# Patient Record
Sex: Male | Born: 1935 | Race: White | Hispanic: No | Marital: Single | Smoking: Never smoker
Health system: Southern US, Community
[De-identification: ages and names within clinical notes are randomized; demographics above are authoritative.]

## PROBLEM LIST (undated history)

## (undated) DIAGNOSIS — R112 Nausea with vomiting, unspecified: Secondary | ICD-10-CM

## (undated) DIAGNOSIS — N210 Calculus in bladder: Secondary | ICD-10-CM

## (undated) DIAGNOSIS — M1990 Unspecified osteoarthritis, unspecified site: Secondary | ICD-10-CM

## (undated) DIAGNOSIS — N400 Enlarged prostate without lower urinary tract symptoms: Secondary | ICD-10-CM

## (undated) DIAGNOSIS — Z9889 Other specified postprocedural states: Secondary | ICD-10-CM

## (undated) DIAGNOSIS — H409 Unspecified glaucoma: Secondary | ICD-10-CM

## (undated) DIAGNOSIS — L409 Psoriasis, unspecified: Secondary | ICD-10-CM

## (undated) DIAGNOSIS — K219 Gastro-esophageal reflux disease without esophagitis: Secondary | ICD-10-CM

## (undated) DIAGNOSIS — Z87898 Personal history of other specified conditions: Secondary | ICD-10-CM

## (undated) HISTORY — PX: COLONOSCOPY: SHX174

---

## 1997-09-30 ENCOUNTER — Other Ambulatory Visit: Admission: RE | Admit: 1997-09-30 | Discharge: 1997-09-30 | Payer: Self-pay | Admitting: Dermatology

## 1998-09-23 ENCOUNTER — Encounter: Admission: RE | Admit: 1998-09-23 | Discharge: 1998-09-28 | Payer: Self-pay

## 1999-03-01 ENCOUNTER — Ambulatory Visit (HOSPITAL_COMMUNITY): Admission: RE | Admit: 1999-03-01 | Discharge: 1999-03-01 | Payer: Self-pay | Admitting: Gastroenterology

## 1999-03-02 ENCOUNTER — Encounter: Payer: Self-pay | Admitting: Gastroenterology

## 2006-04-17 ENCOUNTER — Emergency Department (HOSPITAL_COMMUNITY): Admission: EM | Admit: 2006-04-17 | Discharge: 2006-04-17 | Payer: Self-pay | Admitting: Emergency Medicine

## 2008-03-21 HISTORY — PX: CATARACT EXTRACTION W/ INTRAOCULAR LENS  IMPLANT, BILATERAL: SHX1307

## 2009-11-06 ENCOUNTER — Ambulatory Visit (HOSPITAL_COMMUNITY): Admission: RE | Admit: 2009-11-06 | Discharge: 2009-11-07 | Payer: Self-pay | Admitting: Urology

## 2009-11-06 ENCOUNTER — Encounter (INDEPENDENT_AMBULATORY_CARE_PROVIDER_SITE_OTHER): Payer: Self-pay | Admitting: Urology

## 2009-11-06 HISTORY — PX: TRANSURETHRAL RESECTION OF PROSTATE: SHX73

## 2010-06-04 LAB — COMPREHENSIVE METABOLIC PANEL
ALT: 34 U/L (ref 0–53)
AST: 31 U/L (ref 0–37)
Albumin: 3.7 g/dL (ref 3.5–5.2)
Alkaline Phosphatase: 58 U/L (ref 39–117)
BUN: 19 mg/dL (ref 6–23)
CO2: 27 mEq/L (ref 19–32)
Calcium: 9 mg/dL (ref 8.4–10.5)
Chloride: 112 mEq/L (ref 96–112)
Creatinine, Ser: 1.21 mg/dL (ref 0.4–1.5)
GFR calc Af Amer: >60 mL/min (ref >60)
GFR calc non Af Amer: 59 mL/min — ABNORMAL LOW (ref >60)
Glucose, Bld: 96 mg/dL (ref 70–99)
Potassium: 4.4 mEq/L (ref 3.5–5.1)
Sodium: 143 mEq/L (ref 135–145)
Total Bilirubin: 0.9 mg/dL (ref 0.3–1.2)
Total Protein: 6.3 g/dL (ref 6.0–8.3)

## 2010-06-04 LAB — CBC
HCT: 34.5 % — ABNORMAL LOW (ref 39.0–52.0)
Hemoglobin: 11.5 g/dL — ABNORMAL LOW (ref 13.0–17.0)
Hemoglobin: 9.4 g/dL — ABNORMAL LOW (ref 13.0–17.0)
MCH: 32.9 pg (ref 26.0–34.0)
MCH: 33 pg (ref 26.0–34.0)
MCHC: 33.3 g/dL (ref 30.0–36.0)
MCV: 99 fL (ref 78.0–100.0)
Platelets: 147 10*3/uL — ABNORMAL LOW (ref 150–400)
RBC: 2.87 MIL/uL — ABNORMAL LOW (ref 4.22–5.81)
RBC: 3.49 MIL/uL — ABNORMAL LOW (ref 4.22–5.81)
RDW: 16.6 % — ABNORMAL HIGH (ref 11.5–15.5)
WBC: 4.6 10*3/uL (ref 4.0–10.5)

## 2010-06-04 LAB — PROTIME-INR
INR: 1.1 (ref 0.00–1.49)
Prothrombin Time: 14.4 seconds (ref 11.6–15.2)

## 2010-06-04 LAB — APTT: aPTT: 27 seconds (ref 24–37)

## 2010-06-04 LAB — SURGICAL PCR SCREEN
MRSA, PCR: NEGATIVE
Staphylococcus aureus: POSITIVE — AB

## 2010-06-04 LAB — BASIC METABOLIC PANEL
CO2: 24 mEq/L (ref 19–32)
Calcium: 8 mg/dL — ABNORMAL LOW (ref 8.4–10.5)
GFR calc Af Amer: >60 mL/min (ref >60)
Sodium: 135 mEq/L (ref 135–145)

## 2010-08-09 ENCOUNTER — Other Ambulatory Visit: Payer: Self-pay | Admitting: Dermatology

## 2011-04-11 DIAGNOSIS — L570 Actinic keratosis: Secondary | ICD-10-CM | POA: Diagnosis not present

## 2011-05-11 ENCOUNTER — Other Ambulatory Visit: Payer: Self-pay | Admitting: Dermatology

## 2011-05-11 DIAGNOSIS — L4050 Arthropathic psoriasis, unspecified: Secondary | ICD-10-CM | POA: Diagnosis not present

## 2011-05-11 DIAGNOSIS — Z79899 Other long term (current) drug therapy: Secondary | ICD-10-CM | POA: Diagnosis not present

## 2011-05-11 DIAGNOSIS — C44319 Basal cell carcinoma of skin of other parts of face: Secondary | ICD-10-CM | POA: Diagnosis not present

## 2011-05-11 DIAGNOSIS — L408 Other psoriasis: Secondary | ICD-10-CM | POA: Diagnosis not present

## 2011-05-24 DIAGNOSIS — H409 Unspecified glaucoma: Secondary | ICD-10-CM | POA: Diagnosis not present

## 2011-05-24 DIAGNOSIS — H43819 Vitreous degeneration, unspecified eye: Secondary | ICD-10-CM | POA: Diagnosis not present

## 2011-05-24 DIAGNOSIS — H4010X0 Unspecified open-angle glaucoma, stage unspecified: Secondary | ICD-10-CM | POA: Diagnosis not present

## 2011-05-24 DIAGNOSIS — H04129 Dry eye syndrome of unspecified lacrimal gland: Secondary | ICD-10-CM | POA: Diagnosis not present

## 2011-06-08 DIAGNOSIS — C44319 Basal cell carcinoma of skin of other parts of face: Secondary | ICD-10-CM | POA: Diagnosis not present

## 2011-06-27 DIAGNOSIS — L570 Actinic keratosis: Secondary | ICD-10-CM | POA: Diagnosis not present

## 2011-09-27 DIAGNOSIS — H409 Unspecified glaucoma: Secondary | ICD-10-CM | POA: Diagnosis not present

## 2011-09-27 DIAGNOSIS — H4011X0 Primary open-angle glaucoma, stage unspecified: Secondary | ICD-10-CM | POA: Diagnosis not present

## 2011-10-05 DIAGNOSIS — M79609 Pain in unspecified limb: Secondary | ICD-10-CM | POA: Diagnosis not present

## 2011-10-05 DIAGNOSIS — B351 Tinea unguium: Secondary | ICD-10-CM | POA: Diagnosis not present

## 2011-11-14 DIAGNOSIS — L570 Actinic keratosis: Secondary | ICD-10-CM | POA: Diagnosis not present

## 2011-11-14 DIAGNOSIS — Z79899 Other long term (current) drug therapy: Secondary | ICD-10-CM | POA: Diagnosis not present

## 2011-11-14 DIAGNOSIS — Z85828 Personal history of other malignant neoplasm of skin: Secondary | ICD-10-CM | POA: Diagnosis not present

## 2011-11-14 DIAGNOSIS — L408 Other psoriasis: Secondary | ICD-10-CM | POA: Diagnosis not present

## 2011-12-27 DIAGNOSIS — H409 Unspecified glaucoma: Secondary | ICD-10-CM | POA: Diagnosis not present

## 2011-12-27 DIAGNOSIS — H4011X0 Primary open-angle glaucoma, stage unspecified: Secondary | ICD-10-CM | POA: Diagnosis not present

## 2012-01-27 DIAGNOSIS — H4011X0 Primary open-angle glaucoma, stage unspecified: Secondary | ICD-10-CM | POA: Diagnosis not present

## 2012-01-27 DIAGNOSIS — H409 Unspecified glaucoma: Secondary | ICD-10-CM | POA: Diagnosis not present

## 2012-02-03 DIAGNOSIS — L570 Actinic keratosis: Secondary | ICD-10-CM | POA: Diagnosis not present

## 2012-03-27 DIAGNOSIS — H4011X0 Primary open-angle glaucoma, stage unspecified: Secondary | ICD-10-CM | POA: Diagnosis not present

## 2012-03-27 DIAGNOSIS — H409 Unspecified glaucoma: Secondary | ICD-10-CM | POA: Diagnosis not present

## 2012-05-14 DIAGNOSIS — L408 Other psoriasis: Secondary | ICD-10-CM | POA: Diagnosis not present

## 2012-05-14 DIAGNOSIS — L570 Actinic keratosis: Secondary | ICD-10-CM | POA: Diagnosis not present

## 2012-05-14 DIAGNOSIS — Z79899 Other long term (current) drug therapy: Secondary | ICD-10-CM | POA: Diagnosis not present

## 2012-06-04 DIAGNOSIS — L570 Actinic keratosis: Secondary | ICD-10-CM | POA: Diagnosis not present

## 2012-06-25 DIAGNOSIS — H4011X0 Primary open-angle glaucoma, stage unspecified: Secondary | ICD-10-CM | POA: Diagnosis not present

## 2012-06-25 DIAGNOSIS — H409 Unspecified glaucoma: Secondary | ICD-10-CM | POA: Diagnosis not present

## 2012-07-05 DIAGNOSIS — C44319 Basal cell carcinoma of skin of other parts of face: Secondary | ICD-10-CM | POA: Diagnosis not present

## 2012-07-05 DIAGNOSIS — L570 Actinic keratosis: Secondary | ICD-10-CM | POA: Diagnosis not present

## 2012-07-05 DIAGNOSIS — D485 Neoplasm of uncertain behavior of skin: Secondary | ICD-10-CM | POA: Diagnosis not present

## 2012-07-05 DIAGNOSIS — Z85828 Personal history of other malignant neoplasm of skin: Secondary | ICD-10-CM | POA: Diagnosis not present

## 2012-07-30 DIAGNOSIS — M25569 Pain in unspecified knee: Secondary | ICD-10-CM | POA: Diagnosis not present

## 2012-07-30 DIAGNOSIS — R5381 Other malaise: Secondary | ICD-10-CM | POA: Diagnosis not present

## 2012-07-30 DIAGNOSIS — R5383 Other fatigue: Secondary | ICD-10-CM | POA: Diagnosis not present

## 2012-07-30 DIAGNOSIS — IMO0002 Reserved for concepts with insufficient information to code with codable children: Secondary | ICD-10-CM | POA: Diagnosis not present

## 2012-08-04 DIAGNOSIS — IMO0002 Reserved for concepts with insufficient information to code with codable children: Secondary | ICD-10-CM | POA: Diagnosis not present

## 2012-08-27 DIAGNOSIS — M79609 Pain in unspecified limb: Secondary | ICD-10-CM | POA: Diagnosis not present

## 2012-08-27 DIAGNOSIS — Z23 Encounter for immunization: Secondary | ICD-10-CM | POA: Diagnosis not present

## 2012-08-30 DIAGNOSIS — L03019 Cellulitis of unspecified finger: Secondary | ICD-10-CM | POA: Diagnosis not present

## 2012-09-07 DIAGNOSIS — M76899 Other specified enthesopathies of unspecified lower limb, excluding foot: Secondary | ICD-10-CM | POA: Diagnosis not present

## 2012-09-13 DIAGNOSIS — M76899 Other specified enthesopathies of unspecified lower limb, excluding foot: Secondary | ICD-10-CM | POA: Diagnosis not present

## 2012-09-25 DIAGNOSIS — H2640 Unspecified secondary cataract: Secondary | ICD-10-CM | POA: Diagnosis not present

## 2012-09-25 DIAGNOSIS — H4011X0 Primary open-angle glaucoma, stage unspecified: Secondary | ICD-10-CM | POA: Diagnosis not present

## 2012-09-25 DIAGNOSIS — Z961 Presence of intraocular lens: Secondary | ICD-10-CM | POA: Diagnosis not present

## 2012-09-25 DIAGNOSIS — H409 Unspecified glaucoma: Secondary | ICD-10-CM | POA: Diagnosis not present

## 2012-10-04 DIAGNOSIS — M76899 Other specified enthesopathies of unspecified lower limb, excluding foot: Secondary | ICD-10-CM | POA: Diagnosis not present

## 2012-10-09 DIAGNOSIS — M76899 Other specified enthesopathies of unspecified lower limb, excluding foot: Secondary | ICD-10-CM | POA: Diagnosis not present

## 2012-10-09 DIAGNOSIS — IMO0002 Reserved for concepts with insufficient information to code with codable children: Secondary | ICD-10-CM | POA: Diagnosis not present

## 2012-10-29 DIAGNOSIS — IMO0002 Reserved for concepts with insufficient information to code with codable children: Secondary | ICD-10-CM | POA: Diagnosis not present

## 2012-10-29 DIAGNOSIS — M25539 Pain in unspecified wrist: Secondary | ICD-10-CM | POA: Diagnosis not present

## 2012-11-03 DIAGNOSIS — M25539 Pain in unspecified wrist: Secondary | ICD-10-CM | POA: Diagnosis not present

## 2012-11-13 DIAGNOSIS — M25539 Pain in unspecified wrist: Secondary | ICD-10-CM | POA: Diagnosis not present

## 2012-11-13 DIAGNOSIS — S52133A Displaced fracture of neck of unspecified radius, initial encounter for closed fracture: Secondary | ICD-10-CM | POA: Diagnosis not present

## 2012-11-13 DIAGNOSIS — IMO0002 Reserved for concepts with insufficient information to code with codable children: Secondary | ICD-10-CM | POA: Diagnosis not present

## 2012-12-12 DIAGNOSIS — M25569 Pain in unspecified knee: Secondary | ICD-10-CM | POA: Diagnosis not present

## 2012-12-12 DIAGNOSIS — M25539 Pain in unspecified wrist: Secondary | ICD-10-CM | POA: Diagnosis not present

## 2013-01-08 DIAGNOSIS — H409 Unspecified glaucoma: Secondary | ICD-10-CM | POA: Diagnosis not present

## 2013-01-08 DIAGNOSIS — H4011X0 Primary open-angle glaucoma, stage unspecified: Secondary | ICD-10-CM | POA: Diagnosis not present

## 2013-01-10 ENCOUNTER — Other Ambulatory Visit: Payer: Self-pay | Admitting: Dermatology

## 2013-01-10 DIAGNOSIS — L821 Other seborrheic keratosis: Secondary | ICD-10-CM | POA: Diagnosis not present

## 2013-01-10 DIAGNOSIS — L408 Other psoriasis: Secondary | ICD-10-CM | POA: Diagnosis not present

## 2013-01-10 DIAGNOSIS — Z79899 Other long term (current) drug therapy: Secondary | ICD-10-CM | POA: Diagnosis not present

## 2013-01-10 DIAGNOSIS — D485 Neoplasm of uncertain behavior of skin: Secondary | ICD-10-CM | POA: Diagnosis not present

## 2013-01-10 DIAGNOSIS — L570 Actinic keratosis: Secondary | ICD-10-CM | POA: Diagnosis not present

## 2013-01-10 DIAGNOSIS — Z85828 Personal history of other malignant neoplasm of skin: Secondary | ICD-10-CM | POA: Diagnosis not present

## 2013-01-10 DIAGNOSIS — C4441 Basal cell carcinoma of skin of scalp and neck: Secondary | ICD-10-CM | POA: Diagnosis not present

## 2013-01-10 DIAGNOSIS — C44611 Basal cell carcinoma of skin of unspecified upper limb, including shoulder: Secondary | ICD-10-CM | POA: Diagnosis not present

## 2013-01-11 DIAGNOSIS — S52133A Displaced fracture of neck of unspecified radius, initial encounter for closed fracture: Secondary | ICD-10-CM | POA: Diagnosis not present

## 2013-01-11 DIAGNOSIS — IMO0002 Reserved for concepts with insufficient information to code with codable children: Secondary | ICD-10-CM | POA: Diagnosis not present

## 2013-01-16 DIAGNOSIS — S52133A Displaced fracture of neck of unspecified radius, initial encounter for closed fracture: Secondary | ICD-10-CM | POA: Diagnosis not present

## 2013-01-22 DIAGNOSIS — S52133A Displaced fracture of neck of unspecified radius, initial encounter for closed fracture: Secondary | ICD-10-CM | POA: Diagnosis not present

## 2013-01-28 DIAGNOSIS — S52133A Displaced fracture of neck of unspecified radius, initial encounter for closed fracture: Secondary | ICD-10-CM | POA: Diagnosis not present

## 2013-01-31 DIAGNOSIS — S52133A Displaced fracture of neck of unspecified radius, initial encounter for closed fracture: Secondary | ICD-10-CM | POA: Diagnosis not present

## 2013-02-05 DIAGNOSIS — S52133A Displaced fracture of neck of unspecified radius, initial encounter for closed fracture: Secondary | ICD-10-CM | POA: Diagnosis not present

## 2013-02-07 DIAGNOSIS — S52133A Displaced fracture of neck of unspecified radius, initial encounter for closed fracture: Secondary | ICD-10-CM | POA: Diagnosis not present

## 2013-02-12 DIAGNOSIS — S52133A Displaced fracture of neck of unspecified radius, initial encounter for closed fracture: Secondary | ICD-10-CM | POA: Diagnosis not present

## 2013-02-18 DIAGNOSIS — S52133A Displaced fracture of neck of unspecified radius, initial encounter for closed fracture: Secondary | ICD-10-CM | POA: Diagnosis not present

## 2013-02-18 DIAGNOSIS — M25569 Pain in unspecified knee: Secondary | ICD-10-CM | POA: Diagnosis not present

## 2013-02-18 DIAGNOSIS — IMO0002 Reserved for concepts with insufficient information to code with codable children: Secondary | ICD-10-CM | POA: Diagnosis not present

## 2013-02-21 DIAGNOSIS — S52133A Displaced fracture of neck of unspecified radius, initial encounter for closed fracture: Secondary | ICD-10-CM | POA: Diagnosis not present

## 2013-02-25 DIAGNOSIS — M25569 Pain in unspecified knee: Secondary | ICD-10-CM | POA: Diagnosis not present

## 2013-02-26 DIAGNOSIS — S52133A Displaced fracture of neck of unspecified radius, initial encounter for closed fracture: Secondary | ICD-10-CM | POA: Diagnosis not present

## 2013-02-28 DIAGNOSIS — S52133A Displaced fracture of neck of unspecified radius, initial encounter for closed fracture: Secondary | ICD-10-CM | POA: Diagnosis not present

## 2013-03-04 DIAGNOSIS — M25569 Pain in unspecified knee: Secondary | ICD-10-CM | POA: Diagnosis not present

## 2013-03-06 DIAGNOSIS — M25569 Pain in unspecified knee: Secondary | ICD-10-CM | POA: Diagnosis not present

## 2013-03-08 DIAGNOSIS — Z8781 Personal history of (healed) traumatic fracture: Secondary | ICD-10-CM | POA: Diagnosis not present

## 2013-03-08 DIAGNOSIS — M8430XA Stress fracture, unspecified site, initial encounter for fracture: Secondary | ICD-10-CM | POA: Diagnosis not present

## 2013-03-08 DIAGNOSIS — M25569 Pain in unspecified knee: Secondary | ICD-10-CM | POA: Diagnosis not present

## 2013-03-12 ENCOUNTER — Other Ambulatory Visit: Payer: Self-pay | Admitting: Dermatology

## 2013-03-12 DIAGNOSIS — L408 Other psoriasis: Secondary | ICD-10-CM | POA: Diagnosis not present

## 2013-03-12 DIAGNOSIS — Z85828 Personal history of other malignant neoplasm of skin: Secondary | ICD-10-CM | POA: Diagnosis not present

## 2013-03-12 DIAGNOSIS — L570 Actinic keratosis: Secondary | ICD-10-CM | POA: Diagnosis not present

## 2013-03-12 DIAGNOSIS — Z79899 Other long term (current) drug therapy: Secondary | ICD-10-CM | POA: Diagnosis not present

## 2013-03-12 DIAGNOSIS — D485 Neoplasm of uncertain behavior of skin: Secondary | ICD-10-CM | POA: Diagnosis not present

## 2013-03-12 DIAGNOSIS — C44319 Basal cell carcinoma of skin of other parts of face: Secondary | ICD-10-CM | POA: Diagnosis not present

## 2013-03-19 DIAGNOSIS — M25569 Pain in unspecified knee: Secondary | ICD-10-CM | POA: Diagnosis not present

## 2013-03-25 DIAGNOSIS — M25569 Pain in unspecified knee: Secondary | ICD-10-CM | POA: Diagnosis not present

## 2013-03-27 DIAGNOSIS — M25569 Pain in unspecified knee: Secondary | ICD-10-CM | POA: Diagnosis not present

## 2013-04-01 DIAGNOSIS — M8430XA Stress fracture, unspecified site, initial encounter for fracture: Secondary | ICD-10-CM | POA: Diagnosis not present

## 2013-04-02 DIAGNOSIS — S52133A Displaced fracture of neck of unspecified radius, initial encounter for closed fracture: Secondary | ICD-10-CM | POA: Diagnosis not present

## 2013-04-02 DIAGNOSIS — M25569 Pain in unspecified knee: Secondary | ICD-10-CM | POA: Diagnosis not present

## 2013-04-02 DIAGNOSIS — M84369A Stress fracture, unspecified tibia and fibula, initial encounter for fracture: Secondary | ICD-10-CM | POA: Diagnosis not present

## 2013-04-04 DIAGNOSIS — S52133A Displaced fracture of neck of unspecified radius, initial encounter for closed fracture: Secondary | ICD-10-CM | POA: Diagnosis not present

## 2013-04-09 DIAGNOSIS — S52133A Displaced fracture of neck of unspecified radius, initial encounter for closed fracture: Secondary | ICD-10-CM | POA: Diagnosis not present

## 2013-04-16 DIAGNOSIS — M25539 Pain in unspecified wrist: Secondary | ICD-10-CM | POA: Diagnosis not present

## 2013-04-18 DIAGNOSIS — M25539 Pain in unspecified wrist: Secondary | ICD-10-CM | POA: Diagnosis not present

## 2013-04-23 DIAGNOSIS — M25539 Pain in unspecified wrist: Secondary | ICD-10-CM | POA: Diagnosis not present

## 2013-04-25 DIAGNOSIS — Z85828 Personal history of other malignant neoplasm of skin: Secondary | ICD-10-CM | POA: Diagnosis not present

## 2013-04-25 DIAGNOSIS — C44319 Basal cell carcinoma of skin of other parts of face: Secondary | ICD-10-CM | POA: Diagnosis not present

## 2013-04-30 DIAGNOSIS — H409 Unspecified glaucoma: Secondary | ICD-10-CM | POA: Diagnosis not present

## 2013-04-30 DIAGNOSIS — H4011X0 Primary open-angle glaucoma, stage unspecified: Secondary | ICD-10-CM | POA: Diagnosis not present

## 2013-05-01 DIAGNOSIS — M25569 Pain in unspecified knee: Secondary | ICD-10-CM | POA: Diagnosis not present

## 2013-05-01 DIAGNOSIS — M84369A Stress fracture, unspecified tibia and fibula, initial encounter for fracture: Secondary | ICD-10-CM | POA: Diagnosis not present

## 2013-05-22 DIAGNOSIS — M810 Age-related osteoporosis without current pathological fracture: Secondary | ICD-10-CM | POA: Diagnosis not present

## 2013-05-31 DIAGNOSIS — M84369A Stress fracture, unspecified tibia and fibula, initial encounter for fracture: Secondary | ICD-10-CM | POA: Diagnosis not present

## 2013-06-03 ENCOUNTER — Other Ambulatory Visit: Payer: Self-pay | Admitting: Sports Medicine

## 2013-06-03 DIAGNOSIS — M84369A Stress fracture, unspecified tibia and fibula, initial encounter for fracture: Secondary | ICD-10-CM

## 2013-06-07 ENCOUNTER — Ambulatory Visit
Admission: RE | Admit: 2013-06-07 | Discharge: 2013-06-07 | Disposition: A | Discharge status: Home / Self Care | Payer: Medicare Other | Source: Ambulatory Visit | Attending: Sports Medicine | Admitting: Sports Medicine

## 2013-06-07 DIAGNOSIS — M25569 Pain in unspecified knee: Secondary | ICD-10-CM | POA: Diagnosis not present

## 2013-06-07 DIAGNOSIS — M1710 Unilateral primary osteoarthritis, unspecified knee: Secondary | ICD-10-CM | POA: Diagnosis not present

## 2013-06-07 DIAGNOSIS — M84369A Stress fracture, unspecified tibia and fibula, initial encounter for fracture: Secondary | ICD-10-CM

## 2013-06-07 DIAGNOSIS — IMO0002 Reserved for concepts with insufficient information to code with codable children: Secondary | ICD-10-CM | POA: Diagnosis not present

## 2013-07-02 DIAGNOSIS — M84369A Stress fracture, unspecified tibia and fibula, initial encounter for fracture: Secondary | ICD-10-CM | POA: Diagnosis not present

## 2013-07-11 ENCOUNTER — Other Ambulatory Visit: Payer: Self-pay | Admitting: Dermatology

## 2013-07-11 DIAGNOSIS — Z79899 Other long term (current) drug therapy: Secondary | ICD-10-CM | POA: Diagnosis not present

## 2013-07-11 DIAGNOSIS — L408 Other psoriasis: Secondary | ICD-10-CM | POA: Diagnosis not present

## 2013-07-11 DIAGNOSIS — L821 Other seborrheic keratosis: Secondary | ICD-10-CM | POA: Diagnosis not present

## 2013-07-11 DIAGNOSIS — C4441 Basal cell carcinoma of skin of scalp and neck: Secondary | ICD-10-CM | POA: Diagnosis not present

## 2013-07-11 DIAGNOSIS — C44621 Squamous cell carcinoma of skin of unspecified upper limb, including shoulder: Secondary | ICD-10-CM | POA: Diagnosis not present

## 2013-07-11 DIAGNOSIS — L570 Actinic keratosis: Secondary | ICD-10-CM | POA: Diagnosis not present

## 2013-07-11 DIAGNOSIS — Z85828 Personal history of other malignant neoplasm of skin: Secondary | ICD-10-CM | POA: Diagnosis not present

## 2013-07-11 DIAGNOSIS — D485 Neoplasm of uncertain behavior of skin: Secondary | ICD-10-CM | POA: Diagnosis not present

## 2013-07-29 DIAGNOSIS — L570 Actinic keratosis: Secondary | ICD-10-CM | POA: Diagnosis not present

## 2013-08-08 DIAGNOSIS — H4011X0 Primary open-angle glaucoma, stage unspecified: Secondary | ICD-10-CM | POA: Diagnosis not present

## 2013-08-08 DIAGNOSIS — H409 Unspecified glaucoma: Secondary | ICD-10-CM | POA: Diagnosis not present

## 2013-08-15 DIAGNOSIS — M84469D Pathological fracture, unspecified tibia and fibula, subsequent encounter for fracture with routine healing: Secondary | ICD-10-CM | POA: Diagnosis not present

## 2013-08-15 DIAGNOSIS — M25569 Pain in unspecified knee: Secondary | ICD-10-CM | POA: Diagnosis not present

## 2013-09-10 DIAGNOSIS — R509 Fever, unspecified: Secondary | ICD-10-CM | POA: Diagnosis not present

## 2013-09-30 DIAGNOSIS — IMO0002 Reserved for concepts with insufficient information to code with codable children: Secondary | ICD-10-CM | POA: Diagnosis not present

## 2013-09-30 DIAGNOSIS — M25429 Effusion, unspecified elbow: Secondary | ICD-10-CM | POA: Diagnosis not present

## 2013-09-30 DIAGNOSIS — M24029 Loose body in unspecified elbow: Secondary | ICD-10-CM | POA: Diagnosis not present

## 2013-09-30 DIAGNOSIS — S52123A Displaced fracture of head of unspecified radius, initial encounter for closed fracture: Secondary | ICD-10-CM | POA: Diagnosis not present

## 2013-10-02 DIAGNOSIS — S52123A Displaced fracture of head of unspecified radius, initial encounter for closed fracture: Secondary | ICD-10-CM | POA: Diagnosis not present

## 2013-10-07 DIAGNOSIS — IMO0002 Reserved for concepts with insufficient information to code with codable children: Secondary | ICD-10-CM | POA: Diagnosis not present

## 2013-10-10 DIAGNOSIS — IMO0002 Reserved for concepts with insufficient information to code with codable children: Secondary | ICD-10-CM | POA: Diagnosis not present

## 2013-10-10 DIAGNOSIS — Z01818 Encounter for other preprocedural examination: Secondary | ICD-10-CM | POA: Diagnosis not present

## 2013-10-10 DIAGNOSIS — S42409A Unspecified fracture of lower end of unspecified humerus, initial encounter for closed fracture: Secondary | ICD-10-CM | POA: Diagnosis not present

## 2013-10-10 DIAGNOSIS — L408 Other psoriasis: Secondary | ICD-10-CM | POA: Diagnosis not present

## 2013-10-10 DIAGNOSIS — I498 Other specified cardiac arrhythmias: Secondary | ICD-10-CM | POA: Diagnosis not present

## 2013-10-10 DIAGNOSIS — S52123A Displaced fracture of head of unspecified radius, initial encounter for closed fracture: Secondary | ICD-10-CM | POA: Diagnosis not present

## 2013-10-10 DIAGNOSIS — Z859 Personal history of malignant neoplasm, unspecified: Secondary | ICD-10-CM | POA: Diagnosis not present

## 2013-10-10 DIAGNOSIS — M4120 Other idiopathic scoliosis, site unspecified: Secondary | ICD-10-CM | POA: Diagnosis not present

## 2013-10-15 DIAGNOSIS — IMO0002 Reserved for concepts with insufficient information to code with codable children: Secondary | ICD-10-CM | POA: Diagnosis not present

## 2013-10-15 DIAGNOSIS — M19039 Primary osteoarthritis, unspecified wrist: Secondary | ICD-10-CM | POA: Diagnosis not present

## 2013-10-15 DIAGNOSIS — S52123A Displaced fracture of head of unspecified radius, initial encounter for closed fracture: Secondary | ICD-10-CM | POA: Diagnosis not present

## 2013-10-29 DIAGNOSIS — IMO0002 Reserved for concepts with insufficient information to code with codable children: Secondary | ICD-10-CM | POA: Diagnosis not present

## 2013-10-29 DIAGNOSIS — M19029 Primary osteoarthritis, unspecified elbow: Secondary | ICD-10-CM | POA: Diagnosis not present

## 2013-10-29 DIAGNOSIS — S52123A Displaced fracture of head of unspecified radius, initial encounter for closed fracture: Secondary | ICD-10-CM | POA: Diagnosis not present

## 2013-10-29 DIAGNOSIS — M25429 Effusion, unspecified elbow: Secondary | ICD-10-CM | POA: Diagnosis not present

## 2013-10-29 DIAGNOSIS — S42309D Unspecified fracture of shaft of humerus, unspecified arm, subsequent encounter for fracture with routine healing: Secondary | ICD-10-CM | POA: Diagnosis not present

## 2013-11-06 DIAGNOSIS — M25629 Stiffness of unspecified elbow, not elsewhere classified: Secondary | ICD-10-CM | POA: Diagnosis not present

## 2013-11-06 DIAGNOSIS — IMO0002 Reserved for concepts with insufficient information to code with codable children: Secondary | ICD-10-CM | POA: Diagnosis not present

## 2013-11-07 DIAGNOSIS — H409 Unspecified glaucoma: Secondary | ICD-10-CM | POA: Diagnosis not present

## 2013-11-07 DIAGNOSIS — H4011X0 Primary open-angle glaucoma, stage unspecified: Secondary | ICD-10-CM | POA: Diagnosis not present

## 2013-11-13 DIAGNOSIS — M25629 Stiffness of unspecified elbow, not elsewhere classified: Secondary | ICD-10-CM | POA: Diagnosis not present

## 2013-11-13 DIAGNOSIS — IMO0002 Reserved for concepts with insufficient information to code with codable children: Secondary | ICD-10-CM | POA: Diagnosis not present

## 2013-11-14 ENCOUNTER — Other Ambulatory Visit: Payer: Self-pay | Admitting: Dermatology

## 2013-11-14 DIAGNOSIS — Z79899 Other long term (current) drug therapy: Secondary | ICD-10-CM | POA: Diagnosis not present

## 2013-11-14 DIAGNOSIS — L408 Other psoriasis: Secondary | ICD-10-CM | POA: Diagnosis not present

## 2013-11-14 DIAGNOSIS — D485 Neoplasm of uncertain behavior of skin: Secondary | ICD-10-CM | POA: Diagnosis not present

## 2013-11-14 DIAGNOSIS — Z85828 Personal history of other malignant neoplasm of skin: Secondary | ICD-10-CM | POA: Diagnosis not present

## 2013-11-14 DIAGNOSIS — C44319 Basal cell carcinoma of skin of other parts of face: Secondary | ICD-10-CM | POA: Diagnosis not present

## 2013-11-14 DIAGNOSIS — L570 Actinic keratosis: Secondary | ICD-10-CM | POA: Diagnosis not present

## 2013-11-22 DIAGNOSIS — IMO0002 Reserved for concepts with insufficient information to code with codable children: Secondary | ICD-10-CM | POA: Diagnosis not present

## 2013-11-22 DIAGNOSIS — M25629 Stiffness of unspecified elbow, not elsewhere classified: Secondary | ICD-10-CM | POA: Diagnosis not present

## 2013-11-27 DIAGNOSIS — M25629 Stiffness of unspecified elbow, not elsewhere classified: Secondary | ICD-10-CM | POA: Diagnosis not present

## 2013-12-02 DIAGNOSIS — S5290XD Unspecified fracture of unspecified forearm, subsequent encounter for closed fracture with routine healing: Secondary | ICD-10-CM | POA: Diagnosis not present

## 2013-12-02 DIAGNOSIS — IMO0002 Reserved for concepts with insufficient information to code with codable children: Secondary | ICD-10-CM | POA: Diagnosis not present

## 2013-12-10 DIAGNOSIS — M25629 Stiffness of unspecified elbow, not elsewhere classified: Secondary | ICD-10-CM | POA: Diagnosis not present

## 2013-12-17 DIAGNOSIS — M25629 Stiffness of unspecified elbow, not elsewhere classified: Secondary | ICD-10-CM | POA: Diagnosis not present

## 2013-12-17 DIAGNOSIS — IMO0002 Reserved for concepts with insufficient information to code with codable children: Secondary | ICD-10-CM | POA: Diagnosis not present

## 2013-12-18 ENCOUNTER — Other Ambulatory Visit: Payer: Self-pay | Admitting: Dermatology

## 2013-12-18 DIAGNOSIS — Z85828 Personal history of other malignant neoplasm of skin: Secondary | ICD-10-CM | POA: Diagnosis not present

## 2013-12-18 DIAGNOSIS — L570 Actinic keratosis: Secondary | ICD-10-CM | POA: Diagnosis not present

## 2013-12-18 DIAGNOSIS — D485 Neoplasm of uncertain behavior of skin: Secondary | ICD-10-CM | POA: Diagnosis not present

## 2013-12-18 DIAGNOSIS — C44319 Basal cell carcinoma of skin of other parts of face: Secondary | ICD-10-CM | POA: Diagnosis not present

## 2013-12-30 DIAGNOSIS — M25622 Stiffness of left elbow, not elsewhere classified: Secondary | ICD-10-CM | POA: Diagnosis not present

## 2014-01-06 DIAGNOSIS — S52122K Displaced fracture of head of left radius, subsequent encounter for closed fracture with nonunion: Secondary | ICD-10-CM | POA: Diagnosis not present

## 2014-01-08 DIAGNOSIS — M25622 Stiffness of left elbow, not elsewhere classified: Secondary | ICD-10-CM | POA: Diagnosis not present

## 2014-01-15 DIAGNOSIS — M25622 Stiffness of left elbow, not elsewhere classified: Secondary | ICD-10-CM | POA: Diagnosis not present

## 2014-01-22 DIAGNOSIS — M25622 Stiffness of left elbow, not elsewhere classified: Secondary | ICD-10-CM | POA: Diagnosis not present

## 2014-01-22 DIAGNOSIS — M25522 Pain in left elbow: Secondary | ICD-10-CM | POA: Diagnosis not present

## 2014-02-04 DIAGNOSIS — S52122A Displaced fracture of head of left radius, initial encounter for closed fracture: Secondary | ICD-10-CM | POA: Diagnosis not present

## 2014-02-06 DIAGNOSIS — Z85828 Personal history of other malignant neoplasm of skin: Secondary | ICD-10-CM | POA: Diagnosis not present

## 2014-02-06 DIAGNOSIS — C44119 Basal cell carcinoma of skin of left eyelid, including canthus: Secondary | ICD-10-CM | POA: Diagnosis not present

## 2014-02-27 DIAGNOSIS — H4011X3 Primary open-angle glaucoma, severe stage: Secondary | ICD-10-CM | POA: Diagnosis not present

## 2014-02-27 DIAGNOSIS — H26492 Other secondary cataract, left eye: Secondary | ICD-10-CM | POA: Diagnosis not present

## 2014-02-27 DIAGNOSIS — Z961 Presence of intraocular lens: Secondary | ICD-10-CM | POA: Diagnosis not present

## 2014-03-11 DIAGNOSIS — S52122A Displaced fracture of head of left radius, initial encounter for closed fracture: Secondary | ICD-10-CM | POA: Diagnosis not present

## 2014-04-10 ENCOUNTER — Other Ambulatory Visit: Payer: Self-pay | Admitting: Orthopedic Surgery

## 2014-04-10 ENCOUNTER — Encounter (HOSPITAL_BASED_OUTPATIENT_CLINIC_OR_DEPARTMENT_OTHER): Payer: Self-pay | Admitting: *Deleted

## 2014-04-10 NOTE — Progress Notes (Signed)
No cardiac or resp problems

## 2014-04-11 ENCOUNTER — Ambulatory Visit (HOSPITAL_BASED_OUTPATIENT_CLINIC_OR_DEPARTMENT_OTHER): Payer: Medicare Other | Admitting: Anesthesiology

## 2014-04-11 ENCOUNTER — Encounter (HOSPITAL_BASED_OUTPATIENT_CLINIC_OR_DEPARTMENT_OTHER)
Admission: RE | Disposition: A | Discharge status: Home / Self Care | Payer: Self-pay | Source: Ambulatory Visit | Attending: Orthopedic Surgery

## 2014-04-11 ENCOUNTER — Encounter (HOSPITAL_BASED_OUTPATIENT_CLINIC_OR_DEPARTMENT_OTHER): Payer: Self-pay | Admitting: Anesthesiology

## 2014-04-11 ENCOUNTER — Ambulatory Visit (HOSPITAL_BASED_OUTPATIENT_CLINIC_OR_DEPARTMENT_OTHER)
Admission: RE | Admit: 2014-04-11 | Discharge: 2014-04-11 | Disposition: A | Discharge status: Home / Self Care | Payer: Medicare Other | Source: Ambulatory Visit | Attending: Orthopedic Surgery | Admitting: Orthopedic Surgery

## 2014-04-11 DIAGNOSIS — K219 Gastro-esophageal reflux disease without esophagitis: Secondary | ICD-10-CM | POA: Diagnosis not present

## 2014-04-11 DIAGNOSIS — S52122S Displaced fracture of head of left radius, sequela: Secondary | ICD-10-CM | POA: Diagnosis not present

## 2014-04-11 DIAGNOSIS — S52122A Displaced fracture of head of left radius, initial encounter for closed fracture: Secondary | ICD-10-CM | POA: Diagnosis not present

## 2014-04-11 DIAGNOSIS — G8918 Other acute postprocedural pain: Secondary | ICD-10-CM | POA: Diagnosis not present

## 2014-04-11 DIAGNOSIS — M25522 Pain in left elbow: Secondary | ICD-10-CM | POA: Diagnosis not present

## 2014-04-11 HISTORY — DX: Unspecified glaucoma: H40.9

## 2014-04-11 HISTORY — DX: Unspecified osteoarthritis, unspecified site: M19.90

## 2014-04-11 HISTORY — PX: RADIAL HEAD ARTHROPLASTY: SHX6044

## 2014-04-11 HISTORY — DX: Psoriasis, unspecified: L40.9

## 2014-04-11 HISTORY — DX: Gastro-esophageal reflux disease without esophagitis: K21.9

## 2014-04-11 SURGERY — ARTHROPLASTY, RADIUS, HEAD
Anesthesia: General | Site: Elbow | Laterality: Left

## 2014-04-11 MED ORDER — ONDANSETRON HCL 4 MG/2ML IJ SOLN: 4.0000 mg | Freq: Once | INTRAMUSCULAR | Status: DC | PRN

## 2014-04-11 MED ORDER — ONDANSETRON HCL 4 MG/2ML IJ SOLN
INTRAMUSCULAR | Status: DC | PRN
  Administered 2014-04-11: 4 mg via INTRAVENOUS

## 2014-04-11 MED ORDER — FENTANYL CITRATE 0.05 MG/ML IJ SOLN
INTRAMUSCULAR | Status: AC
  Filled 2014-04-11: qty 6

## 2014-04-11 MED ORDER — OXYCODONE-ACETAMINOPHEN 5-325 MG PO TABS: 1.0000 | ORAL_TABLET | ORAL | Status: DC | PRN

## 2014-04-11 MED ORDER — CEFAZOLIN SODIUM-DEXTROSE 2-3 GM-% IV SOLR
INTRAVENOUS | Status: AC
  Filled 2014-04-11: qty 50

## 2014-04-11 MED ORDER — CEFAZOLIN SODIUM-DEXTROSE 2-3 GM-% IV SOLR: 2.0000 g | INTRAVENOUS | Status: DC

## 2014-04-11 MED ORDER — FENTANYL CITRATE 0.05 MG/ML IJ SOLN
INTRAMUSCULAR | Status: AC
  Filled 2014-04-11: qty 2

## 2014-04-11 MED ORDER — PROPOFOL 10 MG/ML IV BOLUS
INTRAVENOUS | Status: DC | PRN
  Administered 2014-04-11: 150 mg via INTRAVENOUS

## 2014-04-11 MED ORDER — GLYCOPYRROLATE 0.2 MG/ML IJ SOLN
INTRAMUSCULAR | Status: DC | PRN
  Administered 2014-04-11: 0.2 mg via INTRAVENOUS

## 2014-04-11 MED ORDER — BUPIVACAINE HCL (PF) 0.5 % IJ SOLN
INTRAMUSCULAR | Status: AC
  Filled 2014-04-11: qty 30

## 2014-04-11 MED ORDER — FENTANYL CITRATE 0.05 MG/ML IJ SOLN
50.0000 ug | INTRAMUSCULAR | Status: DC | PRN
  Administered 2014-04-11: 100 ug via INTRAVENOUS

## 2014-04-11 MED ORDER — DEXAMETHASONE SODIUM PHOSPHATE 10 MG/ML IJ SOLN
INTRAMUSCULAR | Status: DC | PRN
  Administered 2014-04-11: 10 mg via INTRAVENOUS

## 2014-04-11 MED ORDER — PROPOFOL 10 MG/ML IV BOLUS
INTRAVENOUS | Status: AC
  Filled 2014-04-11: qty 80

## 2014-04-11 MED ORDER — PROPOFOL 10 MG/ML IV EMUL
INTRAVENOUS | Status: AC
  Filled 2014-04-11: qty 50

## 2014-04-11 MED ORDER — SCOPOLAMINE 1 MG/3DAYS TD PT72
MEDICATED_PATCH | TRANSDERMAL | Status: AC
  Filled 2014-04-11: qty 1

## 2014-04-11 MED ORDER — EPHEDRINE SULFATE 50 MG/ML IJ SOLN
INTRAMUSCULAR | Status: DC | PRN
  Administered 2014-04-11: 10 mg via INTRAVENOUS

## 2014-04-11 MED ORDER — MIDAZOLAM HCL 2 MG/2ML IJ SOLN
1.0000 mg | INTRAMUSCULAR | Status: DC | PRN
  Administered 2014-04-11: 2 mg via INTRAVENOUS

## 2014-04-11 MED ORDER — CEFAZOLIN SODIUM-DEXTROSE 2-3 GM-% IV SOLR
INTRAVENOUS | Status: DC | PRN
  Administered 2014-04-11: 2 g via INTRAVENOUS

## 2014-04-11 MED ORDER — MIDAZOLAM HCL 2 MG/2ML IJ SOLN
INTRAMUSCULAR | Status: AC
  Filled 2014-04-11: qty 2

## 2014-04-11 MED ORDER — BUPIVACAINE HCL (PF) 0.25 % IJ SOLN
INTRAMUSCULAR | Status: AC
  Filled 2014-04-11: qty 30

## 2014-04-11 MED ORDER — LIDOCAINE HCL (CARDIAC) 20 MG/ML IV SOLN
INTRAVENOUS | Status: DC | PRN
  Administered 2014-04-11: 50 mg via INTRAVENOUS

## 2014-04-11 MED ORDER — CHLORHEXIDINE GLUCONATE 4 % EX LIQD: 60.0000 mL | Freq: Once | CUTANEOUS | Status: DC

## 2014-04-11 MED ORDER — FENTANYL CITRATE 0.05 MG/ML IJ SOLN: 25.0000 ug | INTRAMUSCULAR | Status: DC | PRN

## 2014-04-11 MED ORDER — BUPIVACAINE-EPINEPHRINE (PF) 0.5% -1:200000 IJ SOLN
INTRAMUSCULAR | Status: DC | PRN
  Administered 2014-04-11: 25 mL via PERINEURAL

## 2014-04-11 MED ORDER — LACTATED RINGERS IV SOLN
INTRAVENOUS | Status: DC
Start: 2014-04-11 — End: 2014-04-11
  Administered 2014-04-11 (×2): via INTRAVENOUS

## 2014-04-11 SURGICAL SUPPLY — 84 items
APL SKNCLS STERI-STRIP NONHPOA (GAUZE/BANDAGES/DRESSINGS) ×1
BAG DECANTER FOR FLEXI CONT (MISCELLANEOUS) IMPLANT
BANDAGE ELASTIC 3 VELCRO ST LF (GAUZE/BANDAGES/DRESSINGS) ×3 IMPLANT
BANDAGE ELASTIC 4 VELCRO ST LF (GAUZE/BANDAGES/DRESSINGS) ×2 IMPLANT
BENZOIN TINCTURE PRP APPL 2/3 (GAUZE/BANDAGES/DRESSINGS) ×2 IMPLANT
BLADE AVERAGE 25MMX9MM (BLADE) ×1
BLADE AVERAGE 25X9 (BLADE) ×2 IMPLANT
BLADE MINI RND TIP GREEN BEAV (BLADE) IMPLANT
BLADE SURG 15 STRL LF DISP TIS (BLADE) ×1 IMPLANT
BLADE SURG 15 STRL SS (BLADE) ×3
BNDG CMPR 9X4 STRL LF SNTH (GAUZE/BANDAGES/DRESSINGS) ×1
BNDG COHESIVE 4X5 TAN STRL (GAUZE/BANDAGES/DRESSINGS) ×2 IMPLANT
BNDG ESMARK 4X9 LF (GAUZE/BANDAGES/DRESSINGS) ×3 IMPLANT
BNDG GAUZE ELAST 4 BULKY (GAUZE/BANDAGES/DRESSINGS) ×2 IMPLANT
CANISTER SUCT 1200ML W/VALVE (MISCELLANEOUS) IMPLANT
CLOSURE STERI-STRIP 1/2X4 (GAUZE/BANDAGES/DRESSINGS) ×1
CLSR STERI-STRIP ANTIMIC 1/2X4 (GAUZE/BANDAGES/DRESSINGS) ×1 IMPLANT
CORDS BIPOLAR (ELECTRODE) ×3 IMPLANT
COVER BACK TABLE 60X90IN (DRAPES) ×3 IMPLANT
CUFF TOURNIQUET SINGLE 18IN (TOURNIQUET CUFF) ×2 IMPLANT
DECANTER SPIKE VIAL GLASS SM (MISCELLANEOUS) IMPLANT
DRAPE EXTREMITY T 121X128X90 (DRAPE) ×3 IMPLANT
DRAPE OEC MINIVIEW 54X84 (DRAPES) ×3 IMPLANT
DRAPE SURG 17X23 STRL (DRAPES) ×3 IMPLANT
DRSG KUZMA FLUFF (GAUZE/BANDAGES/DRESSINGS) IMPLANT
DURAPREP 26ML APPLICATOR (WOUND CARE) ×3 IMPLANT
ELECT REM PT RETURN 9FT ADLT (ELECTROSURGICAL)
ELECTRODE REM PT RTRN 9FT ADLT (ELECTROSURGICAL) IMPLANT
GAUZE SPONGE 4X4 12PLY STRL (GAUZE/BANDAGES/DRESSINGS) ×3 IMPLANT
GAUZE SPONGE 4X4 16PLY XRAY LF (GAUZE/BANDAGES/DRESSINGS) IMPLANT
GAUZE XEROFORM 1X8 LF (GAUZE/BANDAGES/DRESSINGS) IMPLANT
GLOVE BIOGEL PI IND STRL 7.0 (GLOVE) IMPLANT
GLOVE BIOGEL PI IND STRL 8.5 (GLOVE) IMPLANT
GLOVE BIOGEL PI INDICATOR 7.0 (GLOVE) ×2
GLOVE BIOGEL PI INDICATOR 8.5 (GLOVE) ×2
GLOVE ECLIPSE 6.5 STRL STRAW (GLOVE) ×2 IMPLANT
GLOVE SURG ORTHO 8.0 STRL STRW (GLOVE) ×2 IMPLANT
GLOVE SURG SYN 8.0 (GLOVE) ×6 IMPLANT
GLOVE SURG SYN 8.0 PF PI (GLOVE) ×2 IMPLANT
GOWN STRL REUS W/ TWL LRG LVL3 (GOWN DISPOSABLE) ×1 IMPLANT
GOWN STRL REUS W/TWL LRG LVL3 (GOWN DISPOSABLE) ×3
GOWN STRL REUS W/TWL XL LVL3 (GOWN DISPOSABLE) ×6 IMPLANT
HEAD RADIAL 22MM (Head) ×2 IMPLANT
NDL 1/2 CIR CATGUT .05X1.09 (NEEDLE) IMPLANT
NDL HYPO 25X1 1.5 SAFETY (NEEDLE) ×1 IMPLANT
NEEDLE 1/2 CIR CATGUT .05X1.09 (NEEDLE) IMPLANT
NEEDLE HYPO 25X1 1.5 SAFETY (NEEDLE) ×3 IMPLANT
NS IRRIG 1000ML POUR BTL (IV SOLUTION) ×3 IMPLANT
PACK BASIN DAY SURGERY FS (CUSTOM PROCEDURE TRAY) ×3 IMPLANT
PAD CAST 3X4 CTTN HI CHSV (CAST SUPPLIES) ×1 IMPLANT
PAD CAST 4YDX4 CTTN HI CHSV (CAST SUPPLIES) IMPLANT
PADDING CAST ABS 4INX4YD NS (CAST SUPPLIES) ×2
PADDING CAST ABS COTTON 4X4 ST (CAST SUPPLIES) ×1 IMPLANT
PADDING CAST COTTON 3X4 STRL (CAST SUPPLIES) ×3
PADDING CAST COTTON 4X4 STRL (CAST SUPPLIES) ×3
PADDING UNDERCAST 2 STRL (CAST SUPPLIES)
PADDING UNDERCAST 2X4 STRL (CAST SUPPLIES) IMPLANT
PENCIL BUTTON HOLSTER BLD 10FT (ELECTRODE) IMPLANT
SHEET MEDIUM DRAPE 40X70 STRL (DRAPES) ×3 IMPLANT
SLING ARM XL FOAM STRAP (SOFTGOODS) ×2 IMPLANT
SPLINT PLASTER CAST XFAST 4X15 (CAST SUPPLIES) ×5 IMPLANT
SPLINT PLASTER XTRA FAST SET 4 (CAST SUPPLIES) ×10
STAPLER VISISTAT (STAPLE) IMPLANT
STEM IMPLANT ELBOW ALIGN 9X0 (Stem) IMPLANT
STEM RADIAL ALIGN (Stem) ×3 IMPLANT
STOCKINETTE 4X48 STRL (DRAPES) ×3 IMPLANT
SUCTION FRAZIER TIP 10 FR DISP (SUCTIONS) IMPLANT
SUT ETHILON 4 0 PS 2 18 (SUTURE) IMPLANT
SUT FIBERWIRE 2-0 18 17.9 3/8 (SUTURE) ×3
SUT MERSILENE 4 0 P 3 (SUTURE) IMPLANT
SUT PROLENE 0 CT 2 (SUTURE) ×2 IMPLANT
SUT PROLENE 3 0 PS 2 (SUTURE) ×2 IMPLANT
SUT SILK 2 0 FS (SUTURE) IMPLANT
SUT VIC AB 0 SH 27 (SUTURE) ×2 IMPLANT
SUT VIC AB 3-0 FS2 27 (SUTURE) IMPLANT
SUT VICRYL RAPIDE 4-0 (SUTURE) IMPLANT
SUT VICRYL RAPIDE 4/0 PS 2 (SUTURE) IMPLANT
SUTURE FIBERWR 2-0 18 17.9 3/8 (SUTURE) IMPLANT
SYR BULB 3OZ (MISCELLANEOUS) ×3 IMPLANT
SYRINGE 10CC LL (SYRINGE) ×3 IMPLANT
TOWEL OR 17X24 6PK STRL BLUE (TOWEL DISPOSABLE) ×3 IMPLANT
TUBE CONNECTING 20'X1/4 (TUBING)
TUBE CONNECTING 20X1/4 (TUBING) IMPLANT
UNDERPAD 30X30 INCONTINENT (UNDERPADS AND DIAPERS) ×3 IMPLANT

## 2014-04-11 NOTE — Discharge Instructions (Signed)
°  Post Anesthesia Home Care Instructions ° °Activity: °Get plenty of rest for the remainder of the day. A responsible adult should stay with you for 24 hours following the procedure.  °For the next 24 hours, DO NOT: °-Drive a car °-Operate machinery °-Drink alcoholic beverages °-Take any medication unless instructed by your physician °-Make any legal decisions or sign important papers. ° °Meals: °Start with liquid foods such as gelatin or soup. Progress to regular foods as tolerated. Avoid greasy, spicy, heavy foods. If nausea and/or vomiting occur, drink only clear liquids until the nausea and/or vomiting subsides. Call your physician if vomiting continues. ° °Special Instructions/Symptoms: °Your throat may feel dry or sore from the anesthesia or the breathing tube placed in your throat during surgery. If this causes discomfort, gargle with warm salt water. The discomfort should disappear within 24 hours. ° °Regional Anesthesia Blocks ° °1. Numbness or the inability to move the "blocked" extremity may last from 3-48 hours after placement. The length of time depends on the medication injected and your individual response to the medication. If the numbness is not going away after 48 hours, call your surgeon. ° °2. The extremity that is blocked will need to be protected until the numbness is gone and the  Strength has returned. Because you cannot feel it, you will need to take extra care to avoid injury. Because it may be weak, you may have difficulty moving it or using it. You may not know what position it is in without looking at it while the block is in effect. ° °3. For blocks in the legs and feet, returning to weight bearing and walking needs to be done carefully. You will need to wait until the numbness is entirely gone and the strength has returned. You should be able to move your leg and foot normally before you try and bear weight or walk. You will need someone to be with you when you first try to ensure you  do not fall and possibly risk injury. ° °4. Bruising and tenderness at the needle site are common side effects and will resolve in a few days. ° °5. Persistent numbness or new problems with movement should be communicated to the surgeon or the [facility] ([phone])/ [facility] ([phone]). °

## 2014-04-11 NOTE — Anesthesia Procedure Notes (Addendum)
Anesthesia Regional Block:  Supraclavicular block  Pre-Anesthetic Checklist: ,, timeout performed, Correct Patient, Correct Site, Correct Laterality, Correct Procedure, Correct Position, site marked, Risks and benefits discussed,  Surgical consent,  Pre-op evaluation,  At surgeon's request and post-op pain management  Laterality: Left and Upper  Prep: chloraprep       Needles:  Injection technique: Single-shot  Needle Type: Echogenic Stimulator Needle     Needle Length: 5cm 5 cm Needle Gauge: 21 and 21 G    Additional Needles:  Procedures: ultrasound guided (picture in chart) Supraclavicular block Narrative:  Start time: 04/11/2014 10:32 AM End time: 04/11/2014 10:37 AM Injection made incrementally with aspirations every 5 mL.  Performed by: Personally  Anesthesiologist: CREWS, DAVID A   Procedure Name: Intubation Date/Time: 04/11/2014 11:05 AM Performed by: Marrianne Mood Pre-anesthesia Checklist: Patient identified, Emergency Drugs available, Suction available, Patient being monitored and Timeout performed Patient Re-evaluated:Patient Re-evaluated prior to inductionOxygen Delivery Method: Circle System Utilized Preoxygenation: Pre-oxygenation with 100% oxygen Intubation Type: IV induction Ventilation: Mask ventilation without difficulty Laryngoscope Size: Miller and 3 Grade View: Grade III Tube type: Oral Tube size: 8.0 mm Number of attempts: 1 Airway Equipment and Method: Stylet and Oral airway Placement Confirmation: ETT inserted through vocal cords under direct vision,  positive ETCO2 and breath sounds checked- equal and bilateral Tube secured with: Tape Dental Injury: Teeth and Oropharynx as per pre-operative assessment

## 2014-04-11 NOTE — Anesthesia Postprocedure Evaluation (Signed)
  Anesthesia Post-op Note  Patient: Bradley Lewis  Procedure(s) Performed: Procedure(s): LEFT RADIAL HEAD REPLACEMENT  (Left)  Patient Location: PACU  Anesthesia Type:General  Level of Consciousness: awake  Airway and Oxygen Therapy: Patient Spontanous Breathing  Post-op Pain: mild  Post-op Assessment: Post-op Vital signs reviewed  Post-op Vital Signs: Reviewed  Last Vitals:  Filed Vitals:   04/11/14 1300  BP: 136/73  Pulse: 68  Temp:   Resp: 14    Complications: No apparent anesthesia complications

## 2014-04-11 NOTE — Anesthesia Preprocedure Evaluation (Signed)
Anesthesia Evaluation  Patient identified by MRN, date of birth, ID band Patient awake    Reviewed: Allergy & Precautions, NPO status , Patient's Chart, lab work & pertinent test results  Airway Mallampati: I  TM Distance: >3 FB Neck ROM: Full    Dental  (+) Teeth Intact, Dental Advisory Given   Pulmonary  breath sounds clear to auscultation        Cardiovascular Rhythm:Regular Rate:Normal     Neuro/Psych    GI/Hepatic GERD-  Medicated and Controlled,  Endo/Other    Renal/GU      Musculoskeletal   Abdominal   Peds  Hematology   Anesthesia Other Findings   Reproductive/Obstetrics                             Anesthesia Physical Anesthesia Plan  ASA: I  Anesthesia Plan: General   Post-op Pain Management:    Induction: Intravenous  Airway Management Planned: LMA  Additional Equipment:   Intra-op Plan:   Post-operative Plan: Extubation in OR  Informed Consent: I have reviewed the patients History and Physical, chart, labs and discussed the procedure including the risks, benefits and alternatives for the proposed anesthesia with the patient or authorized representative who has indicated his/her understanding and acceptance.   Dental advisory given  Plan Discussed with: CRNA, Anesthesiologist and Surgeon  Anesthesia Plan Comments:         Anesthesia Quick Evaluation

## 2014-04-11 NOTE — H&P (Signed)
Bradley Lewis is an 79 y.o. male.   Chief Complaint: left elbow pain s/p radial head resection HPI: as above s/p left elbow trauma with persistent pain s/p radial head resection  Past Medical History  Diagnosis Date  . Psoriasis   . GERD (gastroesophageal reflux disease)   . Arthritis   . Glaucoma     Past Surgical History  Procedure Laterality Date  . Radial head arthroplasty  7/15    left  . Transurethral resection of prostate  2011  . Colonoscopy      History reviewed. No pertinent family history. Social History:  reports that he has never smoked. He does not have any smokeless tobacco history on file. He reports that he drinks alcohol. He reports that he does not use illicit drugs.  Allergies: No Known Allergies  Medications Prior to Admission  Medication Sig Dispense Refill  . cholecalciferol (VITAMIN D) 1000 UNITS tablet Take 1,000 Units by mouth daily.    . methotrexate (RHEUMATREX) 2.5 MG tablet Take 2.5 mg by mouth once a week. Caution:Chemotherapy. Protect from light.take 6 tablets    . omeprazole (PRILOSEC) 20 MG capsule Take 20 mg by mouth as needed.    . timolol (BETIMOL) 0.25 % ophthalmic solution Place 1-2 drops into both eyes 2 (two) times daily.    . travoprost, benzalkonium, (TRAVATAN) 0.004 % ophthalmic solution Place 1 drop into both eyes at bedtime.      No results found for this or any previous visit (from the past 48 hour(s)). No results found.  Review of Systems  All other systems reviewed and are negative.   Blood pressure 145/82, pulse 59, temperature 98 F (36.7 C), temperature source Oral, resp. rate 20, height 6\' 4"  (1.93 m), weight 96.163 kg (212 lb), SpO2 99 %. Physical Exam  Constitutional: He is oriented to person, place, and time. He appears well-developed and well-nourished.  HENT:  Head: Normocephalic and atraumatic.  Cardiovascular: Normal rate.   Respiratory: Effort normal.  Musculoskeletal:       Left elbow: Tenderness found.  Radial head tenderness noted.  Neurological: He is alert and oriented to person, place, and time.  Skin: Skin is warm.  Psychiatric: He has a normal mood and affect. His behavior is normal. Judgment and thought content normal.     Assessment/Plan As above   Plan left radial head arthoplasty  Tishawn Friedhoff A 04/11/2014, 10:31 AM

## 2014-04-11 NOTE — Progress Notes (Signed)
  Assisted Dr. Crews with left, ultrasound guided, supraclavicular block. Side rails up, monitors on throughout procedure. See vital signs in flow sheet. Tolerated Procedure well. 

## 2014-04-11 NOTE — Transfer of Care (Signed)
Immediate Anesthesia Transfer of Care Note  Patient: Bradley Lewis  Procedure(s) Performed: Procedure(s): LEFT RADIAL HEAD REPLACEMENT  (Left)  Patient Location: PACU  Anesthesia Type:GA combined with regional for post-op pain  Level of Consciousness: awake and patient cooperative  Airway & Oxygen Therapy: Patient Spontanous Breathing and Patient connected to face mask oxygen  Post-op Assessment: Report given to PACU RN and Post -op Vital signs reviewed and stable  Post vital signs: Reviewed and stable  Complications: No apparent anesthesia complications

## 2014-04-11 NOTE — Op Note (Signed)
See note [id number]

## 2014-04-14 ENCOUNTER — Encounter (HOSPITAL_BASED_OUTPATIENT_CLINIC_OR_DEPARTMENT_OTHER): Payer: Self-pay | Admitting: Orthopedic Surgery

## 2014-04-14 NOTE — Op Note (Signed)
Bradley Lewis, HARSHBERGER                 ACCOUNT NO.:  [id number]  MEDICAL RECORD NO.:  [id number]  LOCATION:                                 FACILITY:  PHYSICIAN:  Sheral Apley. Burney Gauze, M.D.   DATE OF BIRTH:  DATE OF PROCEDURE:  04/11/2014 DATE OF DISCHARGE:                              OPERATIVE REPORT   PREOPERATIVE DIAGNOSIS:  Status post radial head dissection left elbow with instability, radiocapitellar area.  POSTOPERATIVE DIAGNOSIS:  Status post radial head dissection left elbow with instability, radiocapitellar area.  PROCEDURE:  Left radial head replacement.  SURGEON:  Sheral Apley. Burney Gauze, M.D. and Daryll Brod, M.D.  ANESTHESIA:  Axillary block and general.  COMPLICATIONS:  No complication.  DRAINS:  No drains.  DESCRIPTION OF PROCEDURE:  The patient was taken to the operating suite. After induction of adequate axillary block analgesia and then supraclavicular block analgesia, and then general laryngeal mask airway anesthetic, the left upper extremities prepped and draped in sterile fashion.  An Esmarch was used to exsanguinate the limb.  Tourniquet was inflated to 250 mmHg.  At this point in time, using his previous incision, we did a standard Kocher type approach to the radiocapitellar joint.  Dissection was carried down to the skin and subcutaneous tissues.  The common extensor tendon ECRL area was identified.  The previous surgical split was identified.  We used this as our interval, dissected down to the capsule around the radial capitellar joint, incised this longitudinally.  We dissected down and found the end of the proximal radius, identified the intramedullary canal.  Using a canal finder, we did a complete synovectomy of the radiocapitellar joint. Once this was done, we then brought the fluoroscopic guide into the field to image the capitellum and the resected head and neck junction of the radial head.  Using a small oscillating saw, we resected about 4 mm of  the head and neck junction smoothly.  We then using the aligned radial head system, used on planer to plane the head and neck junction. We then used sequential rasps to a #9, we then used a 0 neck and a 22 head trial.  This was stable with flexion and extension, pronation and supination.  Once we completed this, the wound was thoroughly irrigated. We then placed a #9 stem, 0 neck and 22 head using the alignment guide.  The wound was irrigated. Closed in layers of 2-0 FiberWire, 0 Vicryl, and a 3-0 Prolene subcuticular stitch.  Steri-Strips, 4 x 4s, fluffs, and a splint was applied.  Perform a neutral wrist bent at 90 degrees.  The patient tolerated the procedure well in concealed fashion.     Sheral Apley Burney Gauze, M.D.     MAW/MEDQ  D:  04/11/2014  T:  04/11/2014  Job:  [id number]

## 2014-04-15 DIAGNOSIS — S52122A Displaced fracture of head of left radius, initial encounter for closed fracture: Secondary | ICD-10-CM | POA: Diagnosis not present

## 2014-04-15 DIAGNOSIS — S52122D Displaced fracture of head of left radius, subsequent encounter for closed fracture with routine healing: Secondary | ICD-10-CM | POA: Diagnosis not present

## 2014-04-15 DIAGNOSIS — M25622 Stiffness of left elbow, not elsewhere classified: Secondary | ICD-10-CM | POA: Diagnosis not present

## 2014-04-15 DIAGNOSIS — M62432 Contracture of muscle, left forearm: Secondary | ICD-10-CM | POA: Diagnosis not present

## 2014-05-08 DIAGNOSIS — S52122D Displaced fracture of head of left radius, subsequent encounter for closed fracture with routine healing: Secondary | ICD-10-CM | POA: Diagnosis not present

## 2014-05-15 DIAGNOSIS — M25522 Pain in left elbow: Secondary | ICD-10-CM | POA: Diagnosis not present

## 2014-05-15 DIAGNOSIS — S52122D Displaced fracture of head of left radius, subsequent encounter for closed fracture with routine healing: Secondary | ICD-10-CM | POA: Diagnosis not present

## 2014-05-15 DIAGNOSIS — M62432 Contracture of muscle, left forearm: Secondary | ICD-10-CM | POA: Diagnosis not present

## 2014-05-15 DIAGNOSIS — M24522 Contracture, left elbow: Secondary | ICD-10-CM | POA: Diagnosis not present

## 2014-05-19 DIAGNOSIS — M24522 Contracture, left elbow: Secondary | ICD-10-CM | POA: Diagnosis not present

## 2014-05-19 DIAGNOSIS — M25522 Pain in left elbow: Secondary | ICD-10-CM | POA: Diagnosis not present

## 2014-05-19 DIAGNOSIS — S52122D Displaced fracture of head of left radius, subsequent encounter for closed fracture with routine healing: Secondary | ICD-10-CM | POA: Diagnosis not present

## 2014-05-21 ENCOUNTER — Other Ambulatory Visit: Payer: Self-pay | Admitting: Dermatology

## 2014-05-21 DIAGNOSIS — L570 Actinic keratosis: Secondary | ICD-10-CM | POA: Diagnosis not present

## 2014-05-21 DIAGNOSIS — L400 Psoriasis vulgaris: Secondary | ICD-10-CM | POA: Diagnosis not present

## 2014-05-21 DIAGNOSIS — Z85828 Personal history of other malignant neoplasm of skin: Secondary | ICD-10-CM | POA: Diagnosis not present

## 2014-05-21 DIAGNOSIS — Z79899 Other long term (current) drug therapy: Secondary | ICD-10-CM | POA: Diagnosis not present

## 2014-05-21 DIAGNOSIS — D485 Neoplasm of uncertain behavior of skin: Secondary | ICD-10-CM | POA: Diagnosis not present

## 2014-05-21 DIAGNOSIS — C44329 Squamous cell carcinoma of skin of other parts of face: Secondary | ICD-10-CM | POA: Diagnosis not present

## 2014-05-26 DIAGNOSIS — M25522 Pain in left elbow: Secondary | ICD-10-CM | POA: Diagnosis not present

## 2014-05-26 DIAGNOSIS — S52122D Displaced fracture of head of left radius, subsequent encounter for closed fracture with routine healing: Secondary | ICD-10-CM | POA: Diagnosis not present

## 2014-05-26 DIAGNOSIS — M24522 Contracture, left elbow: Secondary | ICD-10-CM | POA: Diagnosis not present

## 2014-05-28 DIAGNOSIS — M25522 Pain in left elbow: Secondary | ICD-10-CM | POA: Diagnosis not present

## 2014-05-28 DIAGNOSIS — M24522 Contracture, left elbow: Secondary | ICD-10-CM | POA: Diagnosis not present

## 2014-05-28 DIAGNOSIS — S52122D Displaced fracture of head of left radius, subsequent encounter for closed fracture with routine healing: Secondary | ICD-10-CM | POA: Diagnosis not present

## 2014-06-02 DIAGNOSIS — M25522 Pain in left elbow: Secondary | ICD-10-CM | POA: Diagnosis not present

## 2014-06-02 DIAGNOSIS — M24522 Contracture, left elbow: Secondary | ICD-10-CM | POA: Diagnosis not present

## 2014-06-02 DIAGNOSIS — S52122D Displaced fracture of head of left radius, subsequent encounter for closed fracture with routine healing: Secondary | ICD-10-CM | POA: Diagnosis not present

## 2014-06-04 DIAGNOSIS — M24522 Contracture, left elbow: Secondary | ICD-10-CM | POA: Diagnosis not present

## 2014-06-04 DIAGNOSIS — M25522 Pain in left elbow: Secondary | ICD-10-CM | POA: Diagnosis not present

## 2014-06-04 DIAGNOSIS — S52122D Displaced fracture of head of left radius, subsequent encounter for closed fracture with routine healing: Secondary | ICD-10-CM | POA: Diagnosis not present

## 2014-06-09 DIAGNOSIS — S52122D Displaced fracture of head of left radius, subsequent encounter for closed fracture with routine healing: Secondary | ICD-10-CM | POA: Diagnosis not present

## 2014-06-09 DIAGNOSIS — M25522 Pain in left elbow: Secondary | ICD-10-CM | POA: Diagnosis not present

## 2014-06-09 DIAGNOSIS — M24522 Contracture, left elbow: Secondary | ICD-10-CM | POA: Diagnosis not present

## 2014-06-11 DIAGNOSIS — M25522 Pain in left elbow: Secondary | ICD-10-CM | POA: Diagnosis not present

## 2014-06-11 DIAGNOSIS — M24522 Contracture, left elbow: Secondary | ICD-10-CM | POA: Diagnosis not present

## 2014-06-11 DIAGNOSIS — S52122D Displaced fracture of head of left radius, subsequent encounter for closed fracture with routine healing: Secondary | ICD-10-CM | POA: Diagnosis not present

## 2014-06-16 DIAGNOSIS — S52122D Displaced fracture of head of left radius, subsequent encounter for closed fracture with routine healing: Secondary | ICD-10-CM | POA: Diagnosis not present

## 2014-06-16 DIAGNOSIS — M24522 Contracture, left elbow: Secondary | ICD-10-CM | POA: Diagnosis not present

## 2014-06-16 DIAGNOSIS — M25522 Pain in left elbow: Secondary | ICD-10-CM | POA: Diagnosis not present

## 2014-06-18 DIAGNOSIS — S52122D Displaced fracture of head of left radius, subsequent encounter for closed fracture with routine healing: Secondary | ICD-10-CM | POA: Diagnosis not present

## 2014-06-18 DIAGNOSIS — M24522 Contracture, left elbow: Secondary | ICD-10-CM | POA: Diagnosis not present

## 2014-06-18 DIAGNOSIS — M25522 Pain in left elbow: Secondary | ICD-10-CM | POA: Diagnosis not present

## 2014-06-24 DIAGNOSIS — M25522 Pain in left elbow: Secondary | ICD-10-CM | POA: Diagnosis not present

## 2014-06-24 DIAGNOSIS — M24522 Contracture, left elbow: Secondary | ICD-10-CM | POA: Diagnosis not present

## 2014-06-24 DIAGNOSIS — S52122D Displaced fracture of head of left radius, subsequent encounter for closed fracture with routine healing: Secondary | ICD-10-CM | POA: Diagnosis not present

## 2014-06-26 DIAGNOSIS — S52122D Displaced fracture of head of left radius, subsequent encounter for closed fracture with routine healing: Secondary | ICD-10-CM | POA: Diagnosis not present

## 2014-06-26 DIAGNOSIS — M24522 Contracture, left elbow: Secondary | ICD-10-CM | POA: Diagnosis not present

## 2014-06-26 DIAGNOSIS — M25522 Pain in left elbow: Secondary | ICD-10-CM | POA: Diagnosis not present

## 2014-07-02 DIAGNOSIS — S52122D Displaced fracture of head of left radius, subsequent encounter for closed fracture with routine healing: Secondary | ICD-10-CM | POA: Diagnosis not present

## 2014-07-02 DIAGNOSIS — M24522 Contracture, left elbow: Secondary | ICD-10-CM | POA: Diagnosis not present

## 2014-07-02 DIAGNOSIS — M25522 Pain in left elbow: Secondary | ICD-10-CM | POA: Diagnosis not present

## 2014-07-07 DIAGNOSIS — S52122D Displaced fracture of head of left radius, subsequent encounter for closed fracture with routine healing: Secondary | ICD-10-CM | POA: Diagnosis not present

## 2014-07-07 DIAGNOSIS — M24522 Contracture, left elbow: Secondary | ICD-10-CM | POA: Diagnosis not present

## 2014-07-07 DIAGNOSIS — M25522 Pain in left elbow: Secondary | ICD-10-CM | POA: Diagnosis not present

## 2014-07-08 DIAGNOSIS — H4011X3 Primary open-angle glaucoma, severe stage: Secondary | ICD-10-CM | POA: Diagnosis not present

## 2014-07-09 DIAGNOSIS — M24522 Contracture, left elbow: Secondary | ICD-10-CM | POA: Diagnosis not present

## 2014-07-09 DIAGNOSIS — S52122D Displaced fracture of head of left radius, subsequent encounter for closed fracture with routine healing: Secondary | ICD-10-CM | POA: Diagnosis not present

## 2014-07-09 DIAGNOSIS — M25522 Pain in left elbow: Secondary | ICD-10-CM | POA: Diagnosis not present

## 2014-07-14 DIAGNOSIS — S52122D Displaced fracture of head of left radius, subsequent encounter for closed fracture with routine healing: Secondary | ICD-10-CM | POA: Diagnosis not present

## 2014-07-14 DIAGNOSIS — M24522 Contracture, left elbow: Secondary | ICD-10-CM | POA: Diagnosis not present

## 2014-07-14 DIAGNOSIS — M25522 Pain in left elbow: Secondary | ICD-10-CM | POA: Diagnosis not present

## 2014-07-21 DIAGNOSIS — M25522 Pain in left elbow: Secondary | ICD-10-CM | POA: Diagnosis not present

## 2014-07-21 DIAGNOSIS — S52122D Displaced fracture of head of left radius, subsequent encounter for closed fracture with routine healing: Secondary | ICD-10-CM | POA: Diagnosis not present

## 2014-07-21 DIAGNOSIS — M24522 Contracture, left elbow: Secondary | ICD-10-CM | POA: Diagnosis not present

## 2014-07-23 DIAGNOSIS — S52122D Displaced fracture of head of left radius, subsequent encounter for closed fracture with routine healing: Secondary | ICD-10-CM | POA: Diagnosis not present

## 2014-07-23 DIAGNOSIS — M24522 Contracture, left elbow: Secondary | ICD-10-CM | POA: Diagnosis not present

## 2014-07-23 DIAGNOSIS — M25522 Pain in left elbow: Secondary | ICD-10-CM | POA: Diagnosis not present

## 2014-07-28 DIAGNOSIS — M25522 Pain in left elbow: Secondary | ICD-10-CM | POA: Diagnosis not present

## 2014-07-28 DIAGNOSIS — M24522 Contracture, left elbow: Secondary | ICD-10-CM | POA: Diagnosis not present

## 2014-07-28 DIAGNOSIS — S52122D Displaced fracture of head of left radius, subsequent encounter for closed fracture with routine healing: Secondary | ICD-10-CM | POA: Diagnosis not present

## 2014-07-29 DIAGNOSIS — M24522 Contracture, left elbow: Secondary | ICD-10-CM | POA: Diagnosis not present

## 2014-07-29 DIAGNOSIS — M25522 Pain in left elbow: Secondary | ICD-10-CM | POA: Diagnosis not present

## 2014-07-29 DIAGNOSIS — S52122D Displaced fracture of head of left radius, subsequent encounter for closed fracture with routine healing: Secondary | ICD-10-CM | POA: Diagnosis not present

## 2014-07-31 DIAGNOSIS — M25522 Pain in left elbow: Secondary | ICD-10-CM | POA: Diagnosis not present

## 2014-07-31 DIAGNOSIS — M24522 Contracture, left elbow: Secondary | ICD-10-CM | POA: Diagnosis not present

## 2014-07-31 DIAGNOSIS — S52122D Displaced fracture of head of left radius, subsequent encounter for closed fracture with routine healing: Secondary | ICD-10-CM | POA: Diagnosis not present

## 2014-08-05 DIAGNOSIS — M25522 Pain in left elbow: Secondary | ICD-10-CM | POA: Diagnosis not present

## 2014-08-05 DIAGNOSIS — S52122D Displaced fracture of head of left radius, subsequent encounter for closed fracture with routine healing: Secondary | ICD-10-CM | POA: Diagnosis not present

## 2014-08-05 DIAGNOSIS — M24522 Contracture, left elbow: Secondary | ICD-10-CM | POA: Diagnosis not present

## 2014-08-07 DIAGNOSIS — S52122D Displaced fracture of head of left radius, subsequent encounter for closed fracture with routine healing: Secondary | ICD-10-CM | POA: Diagnosis not present

## 2014-08-07 DIAGNOSIS — M25522 Pain in left elbow: Secondary | ICD-10-CM | POA: Diagnosis not present

## 2014-08-07 DIAGNOSIS — M24522 Contracture, left elbow: Secondary | ICD-10-CM | POA: Diagnosis not present

## 2014-08-12 DIAGNOSIS — M272 Inflammatory conditions of jaws: Secondary | ICD-10-CM | POA: Diagnosis not present

## 2014-08-20 DIAGNOSIS — L851 Acquired keratosis [keratoderma] palmaris et plantaris: Secondary | ICD-10-CM | POA: Diagnosis not present

## 2014-08-20 DIAGNOSIS — L821 Other seborrheic keratosis: Secondary | ICD-10-CM | POA: Diagnosis not present

## 2014-08-20 DIAGNOSIS — Z85828 Personal history of other malignant neoplasm of skin: Secondary | ICD-10-CM | POA: Diagnosis not present

## 2014-08-20 DIAGNOSIS — Z79899 Other long term (current) drug therapy: Secondary | ICD-10-CM | POA: Diagnosis not present

## 2014-08-20 DIAGNOSIS — L400 Psoriasis vulgaris: Secondary | ICD-10-CM | POA: Diagnosis not present

## 2014-08-20 DIAGNOSIS — D485 Neoplasm of uncertain behavior of skin: Secondary | ICD-10-CM | POA: Diagnosis not present

## 2014-08-20 DIAGNOSIS — L570 Actinic keratosis: Secondary | ICD-10-CM | POA: Diagnosis not present

## 2014-08-22 DIAGNOSIS — M24522 Contracture, left elbow: Secondary | ICD-10-CM | POA: Diagnosis not present

## 2014-08-22 DIAGNOSIS — M25522 Pain in left elbow: Secondary | ICD-10-CM | POA: Diagnosis not present

## 2014-08-22 DIAGNOSIS — S52122D Displaced fracture of head of left radius, subsequent encounter for closed fracture with routine healing: Secondary | ICD-10-CM | POA: Diagnosis not present

## 2014-08-25 DIAGNOSIS — M272 Inflammatory conditions of jaws: Secondary | ICD-10-CM | POA: Diagnosis not present

## 2014-08-25 DIAGNOSIS — M25522 Pain in left elbow: Secondary | ICD-10-CM | POA: Diagnosis not present

## 2014-08-25 DIAGNOSIS — S52122D Displaced fracture of head of left radius, subsequent encounter for closed fracture with routine healing: Secondary | ICD-10-CM | POA: Diagnosis not present

## 2014-08-25 DIAGNOSIS — M24522 Contracture, left elbow: Secondary | ICD-10-CM | POA: Diagnosis not present

## 2014-08-26 DIAGNOSIS — M24522 Contracture, left elbow: Secondary | ICD-10-CM | POA: Diagnosis not present

## 2014-08-26 DIAGNOSIS — M25522 Pain in left elbow: Secondary | ICD-10-CM | POA: Diagnosis not present

## 2014-08-26 DIAGNOSIS — S52122D Displaced fracture of head of left radius, subsequent encounter for closed fracture with routine healing: Secondary | ICD-10-CM | POA: Diagnosis not present

## 2014-08-26 DIAGNOSIS — R52 Pain, unspecified: Secondary | ICD-10-CM

## 2014-09-02 ENCOUNTER — Encounter: Payer: Self-pay | Admitting: Podiatry

## 2014-09-02 ENCOUNTER — Ambulatory Visit (INDEPENDENT_AMBULATORY_CARE_PROVIDER_SITE_OTHER): Payer: Medicare Other | Admitting: Podiatry

## 2014-09-02 ENCOUNTER — Ambulatory Visit (INDEPENDENT_AMBULATORY_CARE_PROVIDER_SITE_OTHER): Payer: Medicare Other

## 2014-09-02 VITALS — BP 147/68 | HR 78 | Resp 12

## 2014-09-02 DIAGNOSIS — M205X9 Other deformities of toe(s) (acquired), unspecified foot: Secondary | ICD-10-CM | POA: Diagnosis not present

## 2014-09-02 DIAGNOSIS — R52 Pain, unspecified: Secondary | ICD-10-CM

## 2014-09-02 DIAGNOSIS — L84 Corns and callosities: Secondary | ICD-10-CM

## 2014-09-02 NOTE — Progress Notes (Signed)
   Subjective:    Patient ID: Bradley Lewis, male    DOB: 1935/06/18, 79 y.o.   MRN: [id number]  HPI PT STATED RT FOOT GREAT TOE HAVE CALLUS AND BEEN HURTING FOR 2 MONTHS. THE TOE IS GETTING WORSE ESPECIALLY WHEN WALKING/PRESSURE. TRIED TRIM IT DOWN IT HELP SOME.   Review of Systems  Cardiovascular: Positive for leg swelling.  Skin: Positive for color change.       Objective:   Physical Exam        Assessment & Plan:

## 2014-09-03 DIAGNOSIS — M8580 Other specified disorders of bone density and structure, unspecified site: Secondary | ICD-10-CM | POA: Diagnosis not present

## 2014-09-03 DIAGNOSIS — L409 Psoriasis, unspecified: Secondary | ICD-10-CM | POA: Diagnosis not present

## 2014-09-03 DIAGNOSIS — R5383 Other fatigue: Secondary | ICD-10-CM | POA: Diagnosis not present

## 2014-09-03 DIAGNOSIS — H409 Unspecified glaucoma: Secondary | ICD-10-CM | POA: Diagnosis not present

## 2014-09-05 DIAGNOSIS — M25522 Pain in left elbow: Secondary | ICD-10-CM | POA: Diagnosis not present

## 2014-09-05 DIAGNOSIS — M24522 Contracture, left elbow: Secondary | ICD-10-CM | POA: Diagnosis not present

## 2014-09-05 DIAGNOSIS — S52122D Displaced fracture of head of left radius, subsequent encounter for closed fracture with routine healing: Secondary | ICD-10-CM | POA: Diagnosis not present

## 2014-09-06 DIAGNOSIS — L84 Corns and callosities: Secondary | ICD-10-CM | POA: Diagnosis not present

## 2014-09-08 ENCOUNTER — Ambulatory Visit: Payer: Medicare Other | Admitting: Podiatry

## 2014-09-08 NOTE — Progress Notes (Signed)
Subjective:     Patient ID: Bradley Lewis, male   DOB: 1936/03/19, 79 y.o.   MRN: [id number]  HPI patient presents with a plantar callus on the right big toe that sore when pressed and states she also has a bad big toe joint but that does not hurt him the way the toe was hurting him currently   Review of Systems  All other systems reviewed and are negative.      Objective:   Physical Exam  Constitutional: He is oriented to person, place, and time.  Cardiovascular: Intact distal pulses.   Musculoskeletal: Normal range of motion.  Neurological: He is oriented to person, place, and time.  Skin: Skin is warm.  Nursing note and vitals reviewed.  neurovascular status intact with muscle strength adequate range of motion within normal limits. Patient is noted to have a painful keratotic lesion at the interphalangeal joint plantar right hallux and it is sore when pressed. Also is noted to have limitation of motion of the first MPJ bilateral with large spurring formation on the dorsal portion that is moderately painful when pressed. 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 Today I debrided the lesion with no iatrogenic bleeding and applied padding which was tolerated well. Reappoint to recheck

## 2014-09-11 DIAGNOSIS — M24522 Contracture, left elbow: Secondary | ICD-10-CM | POA: Diagnosis not present

## 2014-09-11 DIAGNOSIS — S52122D Displaced fracture of head of left radius, subsequent encounter for closed fracture with routine healing: Secondary | ICD-10-CM | POA: Diagnosis not present

## 2014-09-11 DIAGNOSIS — M25522 Pain in left elbow: Secondary | ICD-10-CM | POA: Diagnosis not present

## 2014-09-12 ENCOUNTER — Ambulatory Visit (INDEPENDENT_AMBULATORY_CARE_PROVIDER_SITE_OTHER): Payer: Medicare Other | Admitting: Podiatry

## 2014-09-12 ENCOUNTER — Encounter: Payer: Self-pay | Admitting: Podiatry

## 2014-09-12 VITALS — BP 132/86 | HR 68 | Resp 12

## 2014-09-12 DIAGNOSIS — R52 Pain, unspecified: Secondary | ICD-10-CM

## 2014-09-12 DIAGNOSIS — M205X9 Other deformities of toe(s) (acquired), unspecified foot: Secondary | ICD-10-CM | POA: Diagnosis not present

## 2014-09-13 NOTE — Progress Notes (Signed)
Subjective:     Patient ID: Robyne Askew, male   DOB: 06-28-1935, 79 y.o.   MRN: [id number]  HPI patient continues to complain of pain in the big toes bilateral plantarly with lesion formation   Review of Systems     Objective:   Physical Exam Vascular status intact keratotic lesion formation plantar aspect left hallux medial side with patient admitting that he has been more active recently    Assessment:     Lesions most likely secondary to activity    Plan:     Debrided the lesions with a small amount of drainage on the left secondary to trauma but localized in nature I cleaned it out and applied sterile dressing with Neosporin. Begin home soaks and padding and reappoint as needed

## 2014-09-15 DIAGNOSIS — M545 Low back pain: Secondary | ICD-10-CM | POA: Diagnosis not present

## 2014-09-17 DIAGNOSIS — M545 Low back pain: Secondary | ICD-10-CM | POA: Diagnosis not present

## 2014-09-23 DIAGNOSIS — M545 Low back pain: Secondary | ICD-10-CM | POA: Diagnosis not present

## 2014-09-25 DIAGNOSIS — M545 Low back pain: Secondary | ICD-10-CM | POA: Diagnosis not present

## 2014-09-25 DIAGNOSIS — S52122D Displaced fracture of head of left radius, subsequent encounter for closed fracture with routine healing: Secondary | ICD-10-CM | POA: Diagnosis not present

## 2014-10-01 ENCOUNTER — Emergency Department (HOSPITAL_COMMUNITY)
Admission: EM | Admit: 2014-10-01 | Discharge: 2014-10-01 | Disposition: A | Discharge status: Home / Self Care | Payer: Medicare Other | Attending: Emergency Medicine | Admitting: Emergency Medicine

## 2014-10-01 ENCOUNTER — Encounter (HOSPITAL_COMMUNITY): Payer: Self-pay

## 2014-10-01 DIAGNOSIS — M1990 Unspecified osteoarthritis, unspecified site: Secondary | ICD-10-CM | POA: Insufficient documentation

## 2014-10-01 DIAGNOSIS — H409 Unspecified glaucoma: Secondary | ICD-10-CM | POA: Insufficient documentation

## 2014-10-01 DIAGNOSIS — M5431 Sciatica, right side: Secondary | ICD-10-CM | POA: Diagnosis not present

## 2014-10-01 DIAGNOSIS — K219 Gastro-esophageal reflux disease without esophagitis: Secondary | ICD-10-CM | POA: Diagnosis not present

## 2014-10-01 DIAGNOSIS — R531 Weakness: Secondary | ICD-10-CM | POA: Diagnosis present

## 2014-10-01 DIAGNOSIS — Z872 Personal history of diseases of the skin and subcutaneous tissue: Secondary | ICD-10-CM | POA: Insufficient documentation

## 2014-10-01 DIAGNOSIS — Z79899 Other long term (current) drug therapy: Secondary | ICD-10-CM | POA: Diagnosis not present

## 2014-10-01 LAB — URINALYSIS, ROUTINE W REFLEX MICROSCOPIC
Bilirubin Urine: NEGATIVE
Glucose, UA: NEGATIVE mg/dL
Hgb urine dipstick: NEGATIVE
Ketones, ur: NEGATIVE mg/dL
Leukocytes, UA: NEGATIVE
Nitrite: NEGATIVE
Protein, ur: NEGATIVE mg/dL
Specific Gravity, Urine: 1.011 (ref 1.005–1.030)
Urobilinogen, UA: 0.2 mg/dL (ref 0.0–1.0)
pH: 7 (ref 5.0–8.0)

## 2014-10-01 MED ORDER — HYDROMORPHONE HCL 1 MG/ML IJ SOLN
1.0000 mg | INTRAMUSCULAR | Status: AC
  Administered 2014-10-01: 1 mg via INTRAMUSCULAR
  Filled 2014-10-01: qty 1

## 2014-10-01 MED ORDER — HYDROMORPHONE HCL 2 MG PO TABS: 2.0000 mg | ORAL_TABLET | Freq: Four times a day (QID) | ORAL | Status: DC | PRN

## 2014-10-01 MED ORDER — DIAZEPAM 5 MG PO TABS: 10.0000 mg | ORAL_TABLET | Freq: Once | ORAL | Status: DC

## 2014-10-01 NOTE — ED Notes (Signed)
MD at bedside. EDP MILLER

## 2014-10-01 NOTE — ED Notes (Signed)
Pt has slipped disc in back.  Has had back pain x 3 weeks.  meds not working.  Pt states difficulty walking d/t pain.  No change in urination.

## 2014-10-01 NOTE — ED Notes (Signed)
Pt able to ambulate without cane, AVS explained in detail. VSS. Knows to call MRI tomorrow and to take prescribed Valium before going. Acknowledges understanding. No other c/c.

## 2014-10-01 NOTE — ED Provider Notes (Signed)
CSN: [id number]     Arrival date & time 10/01/14  1508 History   First MD Initiated Contact with Patient 10/01/14 1822     Chief Complaint  Patient presents with  . Weakness  . Back Pain     (Consider location/radiation/quality/duration/timing/severity/associated sxs/prior Treatment) HPI Comments:  The patient is a 79 year old male, he has a history of intermittent low back pain over the last several decades but over the last couple of weeks he has had increased pain in his back on the right side and going down his right leg. He states this started while he was playing golf, it has been intermittent, worse with position, better when he lays perfectly still, worse with trying to ambulate and not associated with any fevers chills IV drug use cancer numbness weakness or incontinence or urinary retention. He has been seen by his orthopedist, he has been given medications including Ultracet and Vicodin without any relief though it does help him sleep a little bit at night. He has an MRI scheduled for Friday (48 hours).  Patient is a 79 y.o. male presenting with weakness and back pain. The history is provided by the patient.  Weakness  Back Pain Associated symptoms: weakness   Associated symptoms: no fever and no numbness     Past Medical History  Diagnosis Date  . Psoriasis   . GERD (gastroesophageal reflux disease)   . Arthritis   . Glaucoma    Past Surgical History  Procedure Laterality Date  . Radial head arthroplasty  7/15    left  . Transurethral resection of prostate  2011  . Colonoscopy    . Radial head arthroplasty Left 04/11/2014    Procedure: LEFT RADIAL HEAD REPLACEMENT ;  Surgeon: Charlotte Crumb, MD;  Location: [place] [facility];  Service: Orthopedics;  Laterality: Left;   History reviewed. No pertinent family history. History  Substance Use Topics  . Smoking status: Never Smoker   . Smokeless tobacco: Not on file  . Alcohol Use: Yes    Review of Systems   Constitutional: Negative for fever and chills.  Cardiovascular: Negative for leg swelling.  Gastrointestinal: Negative for nausea and vomiting.       No incontinence of bowel  Genitourinary: Negative for difficulty urinating.       No incontinence or retention  Musculoskeletal: Positive for back pain. Negative for neck pain.  Skin: Negative for rash.  Neurological: Positive for weakness. Negative for numbness.      Allergies  Review of patient's allergies indicates no known allergies.  Home Medications   Prior to Admission medications   Medication Sig Start Date End Date Taking? Authorizing Provider  cholecalciferol (VITAMIN D) 1000 UNITS tablet Take 1,000 Units by mouth daily.   Yes Historical Provider, MD  methotrexate (RHEUMATREX) 2.5 MG tablet Take 15 mg by mouth once a week. Caution:Chemotherapy. Protect from light.take 6 tablets On Thursdays.   Yes Historical Provider, MD  omeprazole (PRILOSEC) 20 MG capsule Take 20 mg by mouth as needed.   Yes Historical Provider, MD  oxyCODONE-acetaminophen (ROXICET) 5-325 MG per tablet Take 1 tablet by mouth every 4 (four) hours as needed for severe pain. 04/11/14  Yes Charlotte Crumb, MD  timolol (BETIMOL) 0.25 % ophthalmic solution Place 1-2 drops into both eyes 2 (two) times daily.   Yes Historical Provider, MD  travoprost, benzalkonium, (TRAVATAN) 0.004 % ophthalmic solution Place 1 drop into both eyes at bedtime.   Yes Historical Provider, MD  diazepam (VALIUM) 5 MG tablet  Take 2 tablets (10 mg total) by mouth once. 10/01/14   Noemi Chapel, MD  HYDROmorphone (DILAUDID) 2 MG tablet Take 1 tablet (2 mg total) by mouth every 6 (six) hours as needed. 10/01/14   Noemi Chapel, MD   BP 152/92 mmHg  Pulse 97  Temp(Src) 98.6 F (37 C) (Oral)  Resp 18  Ht 6\' 4"  (1.93 m)  Wt 203 lb (92.08 kg)  BMI 24.72 kg/m2  SpO2 97% Physical Exam  Constitutional: He appears well-developed and well-nourished. No distress.  HENT:  Head: Normocephalic and  atraumatic.  Eyes: Conjunctivae are normal. Right eye exhibits no discharge. Left eye exhibits no discharge. No scleral icterus.  Cardiovascular: Normal rate and regular rhythm.   Pulmonary/Chest: Effort normal and breath sounds normal.  Musculoskeletal: He exhibits no edema.  Tenderness of the back over the right buttocks and right lower back No tenderness over the Cervical, Thoracic or Lumbar Spine  Neurological:  Speech is clear, strength in the UE and LE's are normal at the major muscle groups including the hip, knee and ankles.  Sensation in tact to light touch and pin prick of the bilateral LE's.  Normal reflexes at the knees bilaterally.  Gait antalgic secondary to pain - pain worsened with straight leg raise on the right, relieved with flexion at hip and knee  Skin: Skin is warm and dry. No rash noted. He is not diaphoretic.    ED Course  Procedures (including critical care time) Labs Review Labs Reviewed  URINALYSIS, ROUTINE W REFLEX MICROSCOPIC (NOT AT [facility])    Imaging Review No results found.    MDM   Final diagnoses:  Sciatica, right    Normal strength and sensation bilaterally, symptoms are made worse with movement of the leg, suspect sciatica, we'll try heavier dose of pain medication, patient has MRI on Friday and request a sedative for the MRI, will give low-dose Valium as well. No indication for neuroimaging at this time.  Improvement with Dilaudid intramuscular, vital signs remained in a normal range, patient instructed to not use his other pain medications if using this 1, he will be given a single dose of Valium to take 30 minutes before the MRI, the patient and his fianc are in agreement with the plan.  Meds given in ED:  Medications  HYDROmorphone (DILAUDID) injection 1 mg (1 mg Intramuscular Given 10/01/14 1930)    New Prescriptions   DIAZEPAM (VALIUM) 5 MG TABLET    Take 2 tablets (10 mg total) by mouth once.   HYDROMORPHONE (DILAUDID) 2 MG TABLET     Take 1 tablet (2 mg total) by mouth every 6 (six) hours as needed.      Noemi Chapel, MD 10/01/14 2025

## 2014-10-01 NOTE — ED Notes (Signed)
MD at bedside. 

## 2014-10-01 NOTE — Discharge Instructions (Signed)
Please call your doctor for a followup appointment within 24-48 hours. When you talk to your doctor please let them know that you were seen in the emergency department and have them acquire all of your records so that they can discuss the findings with you and formulate a treatment plan to fully care for your new and ongoing problems. ° °

## 2014-10-03 DIAGNOSIS — M25551 Pain in right hip: Secondary | ICD-10-CM | POA: Diagnosis not present

## 2014-10-03 DIAGNOSIS — M545 Low back pain: Secondary | ICD-10-CM | POA: Diagnosis not present

## 2014-10-07 DIAGNOSIS — M25559 Pain in unspecified hip: Secondary | ICD-10-CM | POA: Diagnosis not present

## 2014-10-07 DIAGNOSIS — S329XXA Fracture of unspecified parts of lumbosacral spine and pelvis, initial encounter for closed fracture: Secondary | ICD-10-CM | POA: Diagnosis not present

## 2014-10-07 DIAGNOSIS — M25551 Pain in right hip: Secondary | ICD-10-CM | POA: Diagnosis not present

## 2014-10-07 DIAGNOSIS — Z125 Encounter for screening for malignant neoplasm of prostate: Secondary | ICD-10-CM | POA: Diagnosis not present

## 2014-10-07 DIAGNOSIS — M84350A Stress fracture, pelvis, initial encounter for fracture: Secondary | ICD-10-CM | POA: Diagnosis not present

## 2014-10-07 DIAGNOSIS — R5383 Other fatigue: Secondary | ICD-10-CM | POA: Diagnosis not present

## 2014-10-07 DIAGNOSIS — M545 Low back pain: Secondary | ICD-10-CM | POA: Diagnosis not present

## 2014-10-15 DIAGNOSIS — H4011X3 Primary open-angle glaucoma, severe stage: Secondary | ICD-10-CM | POA: Diagnosis not present

## 2014-10-16 DIAGNOSIS — S329XXA Fracture of unspecified parts of lumbosacral spine and pelvis, initial encounter for closed fracture: Secondary | ICD-10-CM | POA: Diagnosis not present

## 2014-10-16 DIAGNOSIS — M810 Age-related osteoporosis without current pathological fracture: Secondary | ICD-10-CM | POA: Diagnosis not present

## 2014-10-16 DIAGNOSIS — R748 Abnormal levels of other serum enzymes: Secondary | ICD-10-CM | POA: Diagnosis not present

## 2014-10-21 DIAGNOSIS — M84350D Stress fracture, pelvis, subsequent encounter for fracture with routine healing: Secondary | ICD-10-CM | POA: Diagnosis not present

## 2014-10-21 DIAGNOSIS — M545 Low back pain: Secondary | ICD-10-CM | POA: Diagnosis not present

## 2014-11-07 DIAGNOSIS — H4011X3 Primary open-angle glaucoma, severe stage: Secondary | ICD-10-CM | POA: Diagnosis not present

## 2014-11-18 DIAGNOSIS — M84350D Stress fracture, pelvis, subsequent encounter for fracture with routine healing: Secondary | ICD-10-CM | POA: Diagnosis not present

## 2014-11-25 DIAGNOSIS — L570 Actinic keratosis: Secondary | ICD-10-CM | POA: Diagnosis not present

## 2014-11-25 DIAGNOSIS — L400 Psoriasis vulgaris: Secondary | ICD-10-CM | POA: Diagnosis not present

## 2014-11-25 DIAGNOSIS — Z85828 Personal history of other malignant neoplasm of skin: Secondary | ICD-10-CM | POA: Diagnosis not present

## 2014-11-25 DIAGNOSIS — D485 Neoplasm of uncertain behavior of skin: Secondary | ICD-10-CM | POA: Diagnosis not present

## 2014-11-27 DIAGNOSIS — R748 Abnormal levels of other serum enzymes: Secondary | ICD-10-CM | POA: Diagnosis not present

## 2014-12-01 DIAGNOSIS — H4011X3 Primary open-angle glaucoma, severe stage: Secondary | ICD-10-CM | POA: Diagnosis not present

## 2014-12-03 DIAGNOSIS — R262 Difficulty in walking, not elsewhere classified: Secondary | ICD-10-CM | POA: Diagnosis not present

## 2014-12-03 DIAGNOSIS — M84350D Stress fracture, pelvis, subsequent encounter for fracture with routine healing: Secondary | ICD-10-CM | POA: Diagnosis not present

## 2014-12-03 DIAGNOSIS — M6281 Muscle weakness (generalized): Secondary | ICD-10-CM | POA: Diagnosis not present

## 2014-12-03 DIAGNOSIS — M25552 Pain in left hip: Secondary | ICD-10-CM | POA: Diagnosis not present

## 2014-12-04 DIAGNOSIS — R748 Abnormal levels of other serum enzymes: Secondary | ICD-10-CM | POA: Diagnosis not present

## 2014-12-04 DIAGNOSIS — M810 Age-related osteoporosis without current pathological fracture: Secondary | ICD-10-CM | POA: Diagnosis not present

## 2014-12-04 DIAGNOSIS — S329XXA Fracture of unspecified parts of lumbosacral spine and pelvis, initial encounter for closed fracture: Secondary | ICD-10-CM | POA: Diagnosis not present

## 2014-12-16 DIAGNOSIS — M84350D Stress fracture, pelvis, subsequent encounter for fracture with routine healing: Secondary | ICD-10-CM | POA: Diagnosis not present

## 2014-12-17 DIAGNOSIS — M84350D Stress fracture, pelvis, subsequent encounter for fracture with routine healing: Secondary | ICD-10-CM | POA: Diagnosis not present

## 2014-12-17 DIAGNOSIS — R262 Difficulty in walking, not elsewhere classified: Secondary | ICD-10-CM | POA: Diagnosis not present

## 2014-12-17 DIAGNOSIS — M6281 Muscle weakness (generalized): Secondary | ICD-10-CM | POA: Diagnosis not present

## 2014-12-17 DIAGNOSIS — M25552 Pain in left hip: Secondary | ICD-10-CM | POA: Diagnosis not present

## 2014-12-24 DIAGNOSIS — M6281 Muscle weakness (generalized): Secondary | ICD-10-CM | POA: Diagnosis not present

## 2014-12-24 DIAGNOSIS — M25552 Pain in left hip: Secondary | ICD-10-CM | POA: Diagnosis not present

## 2014-12-24 DIAGNOSIS — M84350D Stress fracture, pelvis, subsequent encounter for fracture with routine healing: Secondary | ICD-10-CM | POA: Diagnosis not present

## 2014-12-24 DIAGNOSIS — R262 Difficulty in walking, not elsewhere classified: Secondary | ICD-10-CM | POA: Diagnosis not present

## 2014-12-31 DIAGNOSIS — N401 Enlarged prostate with lower urinary tract symptoms: Secondary | ICD-10-CM | POA: Diagnosis not present

## 2014-12-31 DIAGNOSIS — E559 Vitamin D deficiency, unspecified: Secondary | ICD-10-CM | POA: Diagnosis not present

## 2014-12-31 DIAGNOSIS — R5383 Other fatigue: Secondary | ICD-10-CM | POA: Diagnosis not present

## 2014-12-31 DIAGNOSIS — M25552 Pain in left hip: Secondary | ICD-10-CM | POA: Diagnosis not present

## 2014-12-31 DIAGNOSIS — L409 Psoriasis, unspecified: Secondary | ICD-10-CM | POA: Diagnosis not present

## 2014-12-31 DIAGNOSIS — R262 Difficulty in walking, not elsewhere classified: Secondary | ICD-10-CM | POA: Diagnosis not present

## 2014-12-31 DIAGNOSIS — M6281 Muscle weakness (generalized): Secondary | ICD-10-CM | POA: Diagnosis not present

## 2014-12-31 DIAGNOSIS — M84350D Stress fracture, pelvis, subsequent encounter for fracture with routine healing: Secondary | ICD-10-CM | POA: Diagnosis not present

## 2014-12-31 DIAGNOSIS — Z125 Encounter for screening for malignant neoplasm of prostate: Secondary | ICD-10-CM | POA: Diagnosis not present

## 2015-01-07 DIAGNOSIS — L409 Psoriasis, unspecified: Secondary | ICD-10-CM | POA: Diagnosis not present

## 2015-01-07 DIAGNOSIS — N401 Enlarged prostate with lower urinary tract symptoms: Secondary | ICD-10-CM | POA: Diagnosis not present

## 2015-01-07 DIAGNOSIS — M150 Primary generalized (osteo)arthritis: Secondary | ICD-10-CM | POA: Diagnosis not present

## 2015-01-07 DIAGNOSIS — R5383 Other fatigue: Secondary | ICD-10-CM | POA: Diagnosis not present

## 2015-01-07 DIAGNOSIS — Z23 Encounter for immunization: Secondary | ICD-10-CM | POA: Diagnosis not present

## 2015-01-08 DIAGNOSIS — M6281 Muscle weakness (generalized): Secondary | ICD-10-CM | POA: Diagnosis not present

## 2015-01-08 DIAGNOSIS — M25552 Pain in left hip: Secondary | ICD-10-CM | POA: Diagnosis not present

## 2015-01-08 DIAGNOSIS — M84350D Stress fracture, pelvis, subsequent encounter for fracture with routine healing: Secondary | ICD-10-CM | POA: Diagnosis not present

## 2015-01-08 DIAGNOSIS — R262 Difficulty in walking, not elsewhere classified: Secondary | ICD-10-CM | POA: Diagnosis not present

## 2015-01-14 DIAGNOSIS — M84350D Stress fracture, pelvis, subsequent encounter for fracture with routine healing: Secondary | ICD-10-CM | POA: Diagnosis not present

## 2015-01-14 DIAGNOSIS — M6281 Muscle weakness (generalized): Secondary | ICD-10-CM | POA: Diagnosis not present

## 2015-01-14 DIAGNOSIS — R262 Difficulty in walking, not elsewhere classified: Secondary | ICD-10-CM | POA: Diagnosis not present

## 2015-01-14 DIAGNOSIS — M25552 Pain in left hip: Secondary | ICD-10-CM | POA: Diagnosis not present

## 2015-01-15 DIAGNOSIS — R350 Frequency of micturition: Secondary | ICD-10-CM | POA: Diagnosis not present

## 2015-01-15 DIAGNOSIS — N401 Enlarged prostate with lower urinary tract symptoms: Secondary | ICD-10-CM | POA: Diagnosis not present

## 2015-01-15 DIAGNOSIS — R3915 Urgency of urination: Secondary | ICD-10-CM | POA: Diagnosis not present

## 2015-01-21 DIAGNOSIS — M84350D Stress fracture, pelvis, subsequent encounter for fracture with routine healing: Secondary | ICD-10-CM | POA: Diagnosis not present

## 2015-01-21 DIAGNOSIS — M6281 Muscle weakness (generalized): Secondary | ICD-10-CM | POA: Diagnosis not present

## 2015-01-21 DIAGNOSIS — M25552 Pain in left hip: Secondary | ICD-10-CM | POA: Diagnosis not present

## 2015-01-21 DIAGNOSIS — R262 Difficulty in walking, not elsewhere classified: Secondary | ICD-10-CM | POA: Diagnosis not present

## 2015-01-28 DIAGNOSIS — M25552 Pain in left hip: Secondary | ICD-10-CM | POA: Diagnosis not present

## 2015-01-28 DIAGNOSIS — M6281 Muscle weakness (generalized): Secondary | ICD-10-CM | POA: Diagnosis not present

## 2015-01-28 DIAGNOSIS — M84350D Stress fracture, pelvis, subsequent encounter for fracture with routine healing: Secondary | ICD-10-CM | POA: Diagnosis not present

## 2015-01-28 DIAGNOSIS — R262 Difficulty in walking, not elsewhere classified: Secondary | ICD-10-CM | POA: Diagnosis not present

## 2015-02-09 DIAGNOSIS — N210 Calculus in bladder: Secondary | ICD-10-CM | POA: Diagnosis not present

## 2015-02-09 DIAGNOSIS — N401 Enlarged prostate with lower urinary tract symptoms: Secondary | ICD-10-CM | POA: Diagnosis not present

## 2015-02-09 DIAGNOSIS — R3915 Urgency of urination: Secondary | ICD-10-CM | POA: Diagnosis not present

## 2015-02-23 DIAGNOSIS — R0602 Shortness of breath: Secondary | ICD-10-CM | POA: Diagnosis not present

## 2015-02-23 DIAGNOSIS — L409 Psoriasis, unspecified: Secondary | ICD-10-CM | POA: Diagnosis not present

## 2015-02-23 DIAGNOSIS — Z01818 Encounter for other preprocedural examination: Secondary | ICD-10-CM | POA: Diagnosis not present

## 2015-02-23 DIAGNOSIS — N401 Enlarged prostate with lower urinary tract symptoms: Secondary | ICD-10-CM | POA: Diagnosis not present

## 2015-02-26 ENCOUNTER — Other Ambulatory Visit: Payer: Self-pay | Admitting: Urology

## 2015-03-05 ENCOUNTER — Encounter (HOSPITAL_BASED_OUTPATIENT_CLINIC_OR_DEPARTMENT_OTHER): Payer: Self-pay | Admitting: *Deleted

## 2015-03-05 NOTE — Progress Notes (Signed)
NPO AFTER MN WITH EXCEPTION CLEAR LIQUIDS UNTIL 0700 (NO CREAM/ MILK PRODUCTS).   ARRIVE AT 1130.  NEEDS HG.  WILL TAKE PRILOSEC AM DOS W/ SIPS OF WATER.

## 2015-03-09 ENCOUNTER — Encounter (HOSPITAL_BASED_OUTPATIENT_CLINIC_OR_DEPARTMENT_OTHER): Payer: Self-pay

## 2015-03-09 ENCOUNTER — Encounter (HOSPITAL_COMMUNITY)
Admission: RE | Disposition: A | Discharge status: Home / Self Care | Payer: Self-pay | Source: Ambulatory Visit | Attending: Urology

## 2015-03-09 ENCOUNTER — Ambulatory Visit (HOSPITAL_BASED_OUTPATIENT_CLINIC_OR_DEPARTMENT_OTHER): Payer: Medicare Other | Admitting: Anesthesiology

## 2015-03-09 ENCOUNTER — Observation Stay (HOSPITAL_BASED_OUTPATIENT_CLINIC_OR_DEPARTMENT_OTHER)
Admission: RE | Admit: 2015-03-09 | Discharge: 2015-03-10 | Disposition: A | Discharge status: Home / Self Care | Payer: Medicare Other | Source: Ambulatory Visit | Attending: Urology | Admitting: Urology

## 2015-03-09 DIAGNOSIS — M1990 Unspecified osteoarthritis, unspecified site: Secondary | ICD-10-CM | POA: Insufficient documentation

## 2015-03-09 DIAGNOSIS — H409 Unspecified glaucoma: Secondary | ICD-10-CM | POA: Insufficient documentation

## 2015-03-09 DIAGNOSIS — R3915 Urgency of urination: Secondary | ICD-10-CM | POA: Insufficient documentation

## 2015-03-09 DIAGNOSIS — N210 Calculus in bladder: Secondary | ICD-10-CM | POA: Diagnosis not present

## 2015-03-09 DIAGNOSIS — K219 Gastro-esophageal reflux disease without esophagitis: Secondary | ICD-10-CM | POA: Diagnosis not present

## 2015-03-09 DIAGNOSIS — N401 Enlarged prostate with lower urinary tract symptoms: Secondary | ICD-10-CM | POA: Diagnosis not present

## 2015-03-09 DIAGNOSIS — L409 Psoriasis, unspecified: Secondary | ICD-10-CM | POA: Diagnosis not present

## 2015-03-09 DIAGNOSIS — Z79899 Other long term (current) drug therapy: Secondary | ICD-10-CM | POA: Diagnosis not present

## 2015-03-09 DIAGNOSIS — N400 Enlarged prostate without lower urinary tract symptoms: Secondary | ICD-10-CM | POA: Diagnosis present

## 2015-03-09 HISTORY — DX: Calculus in bladder: N21.0

## 2015-03-09 HISTORY — PX: TRANSURETHRAL RESECTION OF PROSTATE: SHX73

## 2015-03-09 HISTORY — DX: Personal history of other specified conditions: Z87.898

## 2015-03-09 HISTORY — DX: Other specified postprocedural states: Z98.890

## 2015-03-09 HISTORY — PX: CYSTOSCOPY WITH LITHOLAPAXY: SHX1425

## 2015-03-09 HISTORY — DX: Nausea with vomiting, unspecified: R11.2

## 2015-03-09 HISTORY — DX: Benign prostatic hyperplasia without lower urinary tract symptoms: N40.0

## 2015-03-09 LAB — POCT HEMOGLOBIN-HEMACUE: Hemoglobin: 11.6 g/dL — ABNORMAL LOW (ref 13.0–17.0)

## 2015-03-09 SURGERY — TRANSURETHRAL RESECTION OF THE PROSTATE WITH GYRUS INSTRUMENTS
Anesthesia: General

## 2015-03-09 MED ORDER — DEXAMETHASONE SODIUM PHOSPHATE 4 MG/ML IJ SOLN
INTRAMUSCULAR | Status: DC | PRN
  Administered 2015-03-09: 10 mg via INTRAVENOUS

## 2015-03-09 MED ORDER — PROPOFOL 10 MG/ML IV BOLUS
INTRAVENOUS | Status: AC
  Filled 2015-03-09: qty 20

## 2015-03-09 MED ORDER — EPHEDRINE SULFATE 50 MG/ML IJ SOLN
INTRAMUSCULAR | Status: DC | PRN
  Administered 2015-03-09: 10 mg via INTRAVENOUS
  Administered 2015-03-09 (×2): 15 mg via INTRAVENOUS

## 2015-03-09 MED ORDER — DEXAMETHASONE SODIUM PHOSPHATE 10 MG/ML IJ SOLN
INTRAMUSCULAR | Status: AC
  Filled 2015-03-09: qty 1

## 2015-03-09 MED ORDER — TIMOLOL HEMIHYDRATE 0.25 % OP SOLN
1.0000 [drp] | Freq: Two times a day (BID) | OPHTHALMIC | Status: DC
  Filled 2015-03-09: qty 5

## 2015-03-09 MED ORDER — METOCLOPRAMIDE HCL 5 MG/ML IJ SOLN
INTRAMUSCULAR | Status: AC
  Filled 2015-03-09: qty 2

## 2015-03-09 MED ORDER — CEFAZOLIN SODIUM-DEXTROSE 2-3 GM-% IV SOLR
2.0000 g | INTRAVENOUS | Status: AC
  Administered 2015-03-09: 2 g via INTRAVENOUS
  Filled 2015-03-09: qty 50

## 2015-03-09 MED ORDER — LATANOPROST 0.005 % OP SOLN
1.0000 [drp] | Freq: Every day | OPHTHALMIC | Status: DC
  Filled 2015-03-09: qty 2.5

## 2015-03-09 MED ORDER — ONDANSETRON HCL 4 MG/2ML IJ SOLN
INTRAMUSCULAR | Status: AC
  Filled 2015-03-09: qty 2

## 2015-03-09 MED ORDER — HYDROMORPHONE HCL 1 MG/ML IJ SOLN: 0.5000 mg | INTRAMUSCULAR | Status: DC | PRN

## 2015-03-09 MED ORDER — FENTANYL CITRATE (PF) 100 MCG/2ML IJ SOLN
INTRAMUSCULAR | Status: DC | PRN
  Administered 2015-03-09: 50 ug via INTRAVENOUS

## 2015-03-09 MED ORDER — ONDANSETRON HCL 4 MG/2ML IJ SOLN
INTRAMUSCULAR | Status: DC | PRN
  Administered 2015-03-09: 4 mg via INTRAVENOUS

## 2015-03-09 MED ORDER — CEFAZOLIN SODIUM-DEXTROSE 2-3 GM-% IV SOLR
INTRAVENOUS | Status: AC
  Filled 2015-03-09: qty 50

## 2015-03-09 MED ORDER — LACTATED RINGERS IV SOLN
INTRAVENOUS | Status: DC
Start: 2015-03-09 — End: 2015-03-09
  Administered 2015-03-09: 12:00:00 via INTRAVENOUS
  Filled 2015-03-09: qty 1000

## 2015-03-09 MED ORDER — ACETAMINOPHEN 325 MG PO TABS: 650.0000 mg | ORAL_TABLET | ORAL | Status: DC | PRN

## 2015-03-09 MED ORDER — TRAVOPROST (BAK FREE) 0.004 % OP SOLN: 1.0000 [drp] | Freq: Every day | OPHTHALMIC | Status: DC

## 2015-03-09 MED ORDER — DIPHENHYDRAMINE HCL 50 MG/ML IJ SOLN: 12.5000 mg | Freq: Four times a day (QID) | INTRAMUSCULAR | Status: DC | PRN

## 2015-03-09 MED ORDER — DIPHENHYDRAMINE HCL 12.5 MG/5ML PO ELIX: 12.5000 mg | ORAL_SOLUTION | Freq: Four times a day (QID) | ORAL | Status: DC | PRN

## 2015-03-09 MED ORDER — EPHEDRINE SULFATE 50 MG/ML IJ SOLN
INTRAMUSCULAR | Status: AC
  Filled 2015-03-09: qty 1

## 2015-03-09 MED ORDER — SODIUM CHLORIDE 0.9 % IV SOLN
INTRAVENOUS | Status: DC
  Administered 2015-03-09 – 2015-03-10 (×2): via INTRAVENOUS

## 2015-03-09 MED ORDER — LIDOCAINE HCL (CARDIAC) 20 MG/ML IV SOLN
INTRAVENOUS | Status: DC | PRN
  Administered 2015-03-09: 100 mg via INTRAVENOUS

## 2015-03-09 MED ORDER — TRAVOPROST (BAK FREE) 0.004 % OP SOLN
1.0000 [drp] | Freq: Every day | OPHTHALMIC | Status: DC
  Filled 2015-03-09: qty 2.5

## 2015-03-09 MED ORDER — OXYCODONE-ACETAMINOPHEN 5-325 MG PO TABS: 1.0000 | ORAL_TABLET | ORAL | Status: DC | PRN

## 2015-03-09 MED ORDER — PROPOFOL 10 MG/ML IV BOLUS
INTRAVENOUS | Status: DC | PRN
Start: 2015-03-09 — End: 2015-03-09
  Administered 2015-03-09: 50 mg via INTRAVENOUS
  Administered 2015-03-09: 150 mg via INTRAVENOUS

## 2015-03-09 MED ORDER — LIDOCAINE HCL (CARDIAC) 20 MG/ML IV SOLN
INTRAVENOUS | Status: AC
  Filled 2015-03-09: qty 5

## 2015-03-09 MED ORDER — METOCLOPRAMIDE HCL 5 MG/ML IJ SOLN
INTRAMUSCULAR | Status: DC | PRN
  Administered 2015-03-09: 5 mg via INTRAVENOUS

## 2015-03-09 MED ORDER — BELLADONNA ALKALOIDS-OPIUM 16.2-60 MG RE SUPP: 1.0000 | Freq: Four times a day (QID) | RECTAL | Status: DC | PRN

## 2015-03-09 MED ORDER — OXYCODONE-ACETAMINOPHEN 5-325 MG PO TABS: 1.0000 | ORAL_TABLET | ORAL | Status: AC | PRN

## 2015-03-09 MED ORDER — ONDANSETRON HCL 4 MG/2ML IJ SOLN: 4.0000 mg | INTRAMUSCULAR | Status: DC | PRN

## 2015-03-09 MED ORDER — CEFAZOLIN SODIUM 1-5 GM-% IV SOLN
1.0000 g | INTRAVENOUS | Status: DC
  Filled 2015-03-09: qty 50

## 2015-03-09 MED ORDER — FENTANYL CITRATE (PF) 100 MCG/2ML IJ SOLN
INTRAMUSCULAR | Status: AC
  Filled 2015-03-09: qty 2

## 2015-03-09 MED ORDER — ZOLPIDEM TARTRATE 5 MG PO TABS
5.0000 mg | ORAL_TABLET | Freq: Every evening | ORAL | Status: DC | PRN
  Administered 2015-03-10: 5 mg via ORAL
  Filled 2015-03-09: qty 1

## 2015-03-09 SURGICAL SUPPLY — 26 items
BAG DRN ANRFLXCHMBR STRAP LEK (BAG)
BAG URINE DRAINAGE (UROLOGICAL SUPPLIES) IMPLANT
BAG URINE LEG 19OZ MD ST LTX (BAG) IMPLANT
BAG URINE LEG 500ML (DRAIN) IMPLANT
BAG URO CATCHER STRL LF (MISCELLANEOUS) ×3 IMPLANT
CARTRIDGE STONEBREAK CO2 KIDNE (ELECTROSURGICAL) ×2 IMPLANT
CATH FOLEY 2WAY SLVR  5CC 22FR (CATHETERS)
CATH FOLEY 2WAY SLVR 30CC 20FR (CATHETERS) IMPLANT
CATH FOLEY 2WAY SLVR 5CC 22FR (CATHETERS) IMPLANT
CATH FOLEY 3WAY 30CC 22FR (CATHETERS) ×2 IMPLANT
CLOTH BEACON ORANGE TIMEOUT ST (SAFETY) ×3 IMPLANT
ELECT REM PT RETURN 9FT ADLT (ELECTROSURGICAL) ×3
ELECTRODE REM PT RTRN 9FT ADLT (ELECTROSURGICAL) ×1 IMPLANT
EVACUATOR MICROVAS BLADDER (UROLOGICAL SUPPLIES) IMPLANT
GLOVE BIO SURGEON STRL SZ8 (GLOVE) ×3 IMPLANT
GOWN STRL REUS W/ TWL LRG LVL3 (GOWN DISPOSABLE) ×1 IMPLANT
GOWN STRL REUS W/TWL LRG LVL3 (GOWN DISPOSABLE) ×3
KIT ROOM TURNOVER WOR (KITS) ×3 IMPLANT
LOOP CUT BIPOLAR 24F LRG (ELECTROSURGICAL) ×2 IMPLANT
MANIFOLD NEPTUNE II (INSTRUMENTS) IMPLANT
PACK CYSTO (CUSTOM PROCEDURE TRAY) ×3 IMPLANT
PROBE PNEUMATIC 1.6MM (ELECTROSURGICAL) ×2 IMPLANT
SET ASPIRATION TUBING (TUBING) IMPLANT
SYRINGE IRR TOOMEY STRL 70CC (SYRINGE) IMPLANT
TUBE CONNECTING 12'X1/4 (SUCTIONS)
TUBE CONNECTING 12X1/4 (SUCTIONS) IMPLANT

## 2015-03-09 NOTE — Brief Op Note (Signed)
03/09/2015  2:03 PM  PATIENT:  Bradley Lewis  79 y.o. male  PRE-OPERATIVE DIAGNOSIS:  BLADDER CALCULI, BENIGN PROSTATIC HYPERPLASIA  POST-OPERATIVE DIAGNOSIS:  BLADDER CALCULI, BENIGN PROSTATIC HYPERPLASIA  PROCEDURE:  Procedure(s): TRANSURETHRAL RESECTION OF THE PROSTATE WITH GYRUS INSTRUMENTS (N/A) CYSTOSCOPY WITH CO2 LITHOLAPAXY (N/A)  SURGEON:  Surgeon(s) and Role:    * Cleon Gustin, MD - Primary  PHYSICIAN ASSISTANT:   ASSISTANTS: none   ANESTHESIA:   general  EBL:  Total I/O In: 700 [I.V.:700] Out: -   BLOOD ADMINISTERED:none  DRAINS: Urinary Catheter (Foley)   LOCAL MEDICATIONS USED:  NONE  SPECIMEN:  Source of Specimen:  bladder calculus, prostate chips  DISPOSITION OF SPECIMEN:  PATHOLOGY  COUNTS:  YES  TOURNIQUET:  * No tourniquets in log *  DICTATION: .Note written in EPIC  PLAN OF CARE: Admit for overnight observation  PATIENT DISPOSITION:  PACU - hemodynamically stable.   Delay start of Pharmacological VTE agent (>24hrs) due to surgical blood loss or risk of bleeding: not applicable

## 2015-03-09 NOTE — Discharge Instructions (Signed)
Transurethral Resection of the Prostate, Care After °Refer to this sheet in the next few weeks. These instructions provide you with information on caring for yourself after your procedure. Your caregiver also may give you specific instructions. Your treatment has been planned according to current medical practices, but complications sometimes occur. Call your caregiver if you have any problems or questions after your procedure. °HOME CARE INSTRUCTIONS  °Recovery can take 4-6 weeks. Avoid alcohol, caffeinated drinks, and spicy foods for 2 weeks after your procedure. Drink enough fluids to keep your urine clear or pale yellow. Urinate as soon as you feel the urge to do so. Do not try to hold your urine for long periods of time. °During recovery you may experience pain caused by bladder spasms, which result in a very intense urge to urinate. Take all medicines as directed by your caregiver, including medicines for pain. Try to limit the amount of pain medicines you take because it can cause constipation. If you do become constipated, do not strain to move your bowels. Straining can increase bleeding. Constipation can be minimized by increasing the amount fluids and fiber in your diet. Your caregiver also may prescribe a stool softener. °Do not lift heavy objects (more than 5 lb [2.25 kg]) or perform exercises that cause you to strain for at least 1 month after your procedure. When sitting, you may want to sit in a soft chair or use a cushion. For the first 10 days after your procedure, avoid the following activities: °· Running. °· Strenuous work. °· Long walks. °· Riding in a car for extended periods. °· Sex. °SEEK MEDICAL CARE IF: °· You have difficulty urinating. °· You have blood in your urine that does not go away after you rest or increase your fluid intake. °· You have swelling in your penis or scrotum. °SEEK IMMEDIATE MEDICAL CARE IF:  °· You are suddenly unable to urinate. °· You notice blood clots in your  urine. °· You have chills. °· You have a fever. °· You have pain in your back or lower abdomen. °· You have pain or swelling in your legs. °MAKE SURE YOU:  °· Understand these instructions. °· Will watch your condition. °· Will get help right away if you are not doing well or get worse. °  °This information is not intended to replace advice given to you by your health care provider. Make sure you discuss any questions you have with your health care provider. °  °Document Released: 03/07/2005 Document Revised: 03/28/2014 Document Reviewed: 04/15/2011 °Elsevier Interactive Patient Education ©2016 [employer] ° °

## 2015-03-09 NOTE — H&P (Signed)
Urology Admission H&P  Chief Complaint: dysuria  History of Present Illness: Bradley Lewis is a 79yo with a hx of BPH s/p TURP previously who developed worsening LUTS. On office cysto he was found to have lobe regrowth and a large bladder calculus. He denies hemturia  Past Medical History  Diagnosis Date  . Psoriasis   . GERD (gastroesophageal reflux disease)   . Arthritis   . Glaucoma   . Bladder calculi   . BPH (benign prostatic hyperplasia)   . History of urinary retention   . PONV (postoperative nausea and vomiting)    Past Surgical History  Procedure Laterality Date  . Transurethral resection of prostate  11-06-2009  . Colonoscopy    . Radial head arthroplasty Left 04/11/2014    Procedure: LEFT RADIAL HEAD REPLACEMENT ;  Surgeon: Charlotte Crumb, MD;  Location: [facility];  Service: Orthopedics;  Laterality: Left;  . Cataract extraction w/ intraocular lens  implant, bilateral  2010    Home Medications:  Prescriptions prior to admission  Medication Sig Dispense Refill Last Dose  . Calcium Citrate-Vitamin D (CALCIUM + D PO) Take 1 capsule by mouth 2 (two) times daily.   03/08/2015 at Unknown time  . Cholecalciferol (VITAMIN D3) 5000 UNITS CAPS Take 5 capsules by mouth once a week.   Past Week at Unknown time  . methotrexate (RHEUMATREX) 2.5 MG tablet Take 15 mg by mouth once a week. Caution:Chemotherapy. Protect from light.take 6 tablets On Thursdays.   03/05/2015  . omeprazole (PRILOSEC) 20 MG capsule Take 20 mg by mouth every morning.    03/08/2015 at Unknown time  . timolol (BETIMOL) 0.25 % ophthalmic solution Place 1-2 drops into both eyes 2 (two) times daily.   03/09/2015 at 0800  . travoprost, benzalkonium, (TRAVATAN) 0.004 % ophthalmic solution Place 1 drop into both eyes at bedtime.   03/08/2015 at Unknown time   Allergies: No Known Allergies  History reviewed. No pertinent family history. Social History:  reports that he has never smoked. He has never used  smokeless tobacco. He reports that he drinks alcohol. He reports that he does not use illicit drugs.  Review of Systems  Genitourinary: Positive for dysuria, urgency, frequency and hematuria.  All other systems reviewed and are negative.   Physical Exam:  Vital signs in last 24 hours: Temp:  [98.3 F (36.8 C)] 98.3 F (36.8 C) (12/19 1159) Pulse Rate:  [66] 66 (12/19 1159) Resp:  [16] 16 (12/19 1159) BP: (130)/(80) 130/80 mmHg (12/19 1159) SpO2:  [98 %] 98 % (12/19 1159) Weight:  [94.802 kg (209 lb)] 94.802 kg (209 lb) (12/19 1159) Physical Exam  Constitutional: He is oriented to person, place, and time. He appears well-developed and well-nourished.  HENT:  Head: Normocephalic and atraumatic.  Eyes: EOM are normal. Pupils are equal, round, and reactive to light.  Neck: Normal range of motion. No thyromegaly present.  Cardiovascular: Normal rate and regular rhythm.   Respiratory: Effort normal. No respiratory distress.  GI: Soft. He exhibits no distension.  Musculoskeletal: Normal range of motion.  Neurological: He is alert and oriented to person, place, and time.  Skin: Skin is warm and dry.  Psychiatric: He has a normal mood and affect. His behavior is normal. Judgment and thought content normal.    Laboratory Data:  Results for orders placed or performed during the hospital encounter of 03/09/15 (from the past 24 hour(s))  Hemoglobin-hemacue, POC     Status: Abnormal   Collection Time: 03/09/15 12:19  PM  Result Value Ref Range   Hemoglobin 11.6 (L) 13.0 - 17.0 g/dL   No results found for this or any previous visit (from the past 240 hour(s)). Creatinine: No results for input(s): CREATININE in the last 168 hours. Baseline Creatinine: unknown  Impression/Assessment:  79yo with BPH with LUTS, urgency, dysuria, bladder calculus  Plan:  The risks/benefits/alternatives to cystolithalopaxy and TURP was explained to the patient and he understands and wishes to proceed with  suegery  [name] L 03/09/2015, 1:01 PM

## 2015-03-09 NOTE — Anesthesia Preprocedure Evaluation (Addendum)
Anesthesia Evaluation  Patient identified by MRN, date of birth, ID band Patient awake    Reviewed: Allergy & Precautions, H&P , NPO status , Patient's Chart, lab work & pertinent test results  History of Anesthesia Complications (+) PONV and history of anesthetic complications  Airway Mallampati: I  TM Distance: >3 FB Neck ROM: Full    Dental  (+) Dental Advisory Given, Caps Temporary cap left upper front lateral:   Pulmonary neg pulmonary ROS,    Pulmonary exam normal breath sounds clear to auscultation       Cardiovascular Exercise Tolerance: Good negative cardio ROS Normal cardiovascular exam Rhythm:Regular Rate:Normal     Neuro/Psych negative neurological ROS  negative psych ROS   GI/Hepatic negative GI ROS, Neg liver ROS, GERD  Medicated and Controlled,  Endo/Other  negative endocrine ROS  Renal/GU negative Renal ROS  negative genitourinary   Musculoskeletal   Abdominal   Peds  Hematology negative hematology ROS (+)   Anesthesia Other Findings   Reproductive/Obstetrics negative OB ROS                            Anesthesia Physical Anesthesia Plan  ASA: II  Anesthesia Plan: General   Post-op Pain Management:    Induction: Intravenous  Airway Management Planned: LMA  Additional Equipment:   Intra-op Plan:   Post-operative Plan:   Informed Consent: I have reviewed the patients History and Physical, chart, labs and discussed the procedure including the risks, benefits and alternatives for the proposed anesthesia with the patient or authorized representative who has indicated his/her understanding and acceptance.   Dental Advisory Given  Plan Discussed with: CRNA and Surgeon  Anesthesia Plan Comments:         Anesthesia Quick Evaluation

## 2015-03-09 NOTE — Anesthesia Procedure Notes (Signed)
Procedure Name: LMA Insertion Date/Time: 03/09/2015 1:10 PM Performed by: Mechele Claude Pre-anesthesia Checklist: Patient identified, Emergency Drugs available, Suction available and Patient being monitored Patient Re-evaluated:Patient Re-evaluated prior to inductionOxygen Delivery Method: Circle System Utilized Preoxygenation: Pre-oxygenation with 100% oxygen Intubation Type: IV induction Ventilation: Mask ventilation without difficulty LMA: LMA inserted LMA Size: 5.0 Number of attempts: 1 Airway Equipment and Method: bite block Placement Confirmation: positive ETCO2 Tube secured with: Tape Dental Injury: Teeth and Oropharynx as per pre-operative assessment

## 2015-03-09 NOTE — Transfer of Care (Signed)
   Last Vitals:  Filed Vitals:   03/09/15 1159  BP: 130/80  Pulse: 66  Temp: 36.8 C  Resp: 16    Immediate Anesthesia Transfer of Care Note  Patient: Bradley Lewis  Procedure(s) Performed: Procedure(s) (LRB): TRANSURETHRAL RESECTION OF THE PROSTATE WITH GYRUS INSTRUMENTS (N/A) CYSTOSCOPY WITH CO2 LITHOLAPAXY (N/A)  Patient Location: PACU  Anesthesia Type: General  Level of Consciousness: awake, alert  and oriented  Airway & Oxygen Therapy: Patient Spontanous Breathing and Patient connected to face mask oxygen  Post-op Assessment: Report given to PACU RN and Post -op Vital signs reviewed and stable  Post vital signs: Reviewed and stable  Complications: No apparent anesthesia complications

## 2015-03-10 ENCOUNTER — Encounter (HOSPITAL_BASED_OUTPATIENT_CLINIC_OR_DEPARTMENT_OTHER): Payer: Self-pay | Admitting: Urology

## 2015-03-10 DIAGNOSIS — K219 Gastro-esophageal reflux disease without esophagitis: Secondary | ICD-10-CM | POA: Diagnosis not present

## 2015-03-10 DIAGNOSIS — N210 Calculus in bladder: Secondary | ICD-10-CM | POA: Diagnosis not present

## 2015-03-10 DIAGNOSIS — M1990 Unspecified osteoarthritis, unspecified site: Secondary | ICD-10-CM | POA: Diagnosis not present

## 2015-03-10 DIAGNOSIS — L409 Psoriasis, unspecified: Secondary | ICD-10-CM | POA: Diagnosis not present

## 2015-03-10 DIAGNOSIS — N401 Enlarged prostate with lower urinary tract symptoms: Secondary | ICD-10-CM | POA: Diagnosis not present

## 2015-03-10 DIAGNOSIS — R3915 Urgency of urination: Secondary | ICD-10-CM | POA: Diagnosis not present

## 2015-03-10 LAB — CBC
HEMATOCRIT: 31 % — AB (ref 39.0–52.0)
HEMOGLOBIN: 10.1 g/dL — AB (ref 13.0–17.0)
MCH: 32.1 pg (ref 26.0–34.0)
MCHC: 32.6 g/dL (ref 30.0–36.0)
MCV: 98.4 fL (ref 78.0–100.0)
Platelets: 102 10*3/uL — ABNORMAL LOW (ref 150–400)
RBC: 3.15 MIL/uL — ABNORMAL LOW (ref 4.22–5.81)
RDW: 17.9 % — ABNORMAL HIGH (ref 11.5–15.5)
WBC: 5.4 10*3/uL (ref 4.0–10.5)

## 2015-03-10 LAB — BASIC METABOLIC PANEL
Anion gap: 8 (ref 5–15)
BUN: 17 mg/dL (ref 6–20)
CHLORIDE: 109 mmol/L (ref 101–111)
CO2: 22 mmol/L (ref 22–32)
CREATININE: 0.82 mg/dL (ref 0.61–1.24)
Calcium: 8.5 mg/dL — ABNORMAL LOW (ref 8.9–10.3)
GFR calc Af Amer: >60 mL/min (ref >60)
GFR calc non Af Amer: >60 mL/min (ref >60)
Glucose, Bld: 145 mg/dL — ABNORMAL HIGH (ref 65–99)
Potassium: 4.4 mmol/L (ref 3.5–5.1)
Sodium: 139 mmol/L (ref 135–145)

## 2015-03-10 NOTE — Op Note (Signed)
Preoperative diagnosis: BPH with LUTS, urgency, bladder calculus  Postoperative diagnosis: same  Procedure: 1 cystoscopy 2. Transurethral resection of the prostate 3. Cystolithalopaxy for a stone <2.5cm  Attending: Nicolette Bang  Anesthesia: General  Estimated blood loss: Minimal  Drains: 22 French foley  Specimens: 1. Prostate Chips 2. Bladder calculus  Antibiotics: Ancef  Findings: Trilobar prostate enlargement. Ureteral orifices in normal anatomic location. 1.5cm bladder calculus embedded at that bladder neck   Indications: Patient is a 79 year old male with a history of BPH and severe LUTS and a bladder calculus on office cystoscopy.  After discussing treatment options, they decided proceed with transurethral resection of the prostate and removal of the bladder calculus.  Procedure her in detail: The patient was brought to the operating room and a brief timeout was done to ensure correct patient, correct procedure, correct site.  General anesthesia was administered patient was placed in dorsal lithotomy position.  Their genitalia was then prepped and draped in usual sterile fashion.  A rigid 27 French cystoscope was passed in the urethra and the bladder.  Bladder was inspected and we noted no masses or lesions. We noted a 1.5cm bladder calculus embedded in to the bladder neck. Using the [facility] the stone was fragmented and the fragments were removed.  the ureteral orifices were in the normal orthotopic locations. removed the cystoscope and placed a resectoscope into the bladder. We then turned our attention to the prostate resection. Using the bipolar resectoscope we resected the median lobe first from the bladder neck to the verumontanum. We then started at the 12 oclock position on the left lobe and resection to the 6 o'clock position from the bladder neck to the verumontanum. We then did the same resection of the right lobe. Once the resection was complete we then cauterized  individual bleeders. We then removed the prostate chips and sent them for pathology.  We then re-inspected the prostatic fossa and found no residual bleeding.  the bladder was then drained, a 22 French foley was placed and this concluded the procedure which was well tolerated by patient.  Complications: None  Condition: Stable, extubated, transferred to PACU  Plan: Patient is admitted overnight with continuous bladder irrigation. If their urine is clear tomorrow they will be discharged home and followup in 5 days for foley catheter removal and pathology discussion.

## 2015-03-10 NOTE — Anesthesia Postprocedure Evaluation (Signed)
Anesthesia Post Note  Patient: Bradley Lewis  Procedure(s) Performed: Procedure(s) (LRB): TRANSURETHRAL RESECTION OF THE PROSTATE WITH GYRUS INSTRUMENTS (N/A) CYSTOSCOPY WITH CO2 LITHOLAPAXY (N/A)  Patient location during evaluation: PACU Anesthesia Type: General Level of consciousness: awake and alert Pain management: pain level controlled Vital Signs Assessment: post-procedure vital signs reviewed and stable Respiratory status: spontaneous breathing, nonlabored ventilation, respiratory function stable and patient connected to nasal cannula oxygen Cardiovascular status: blood pressure returned to baseline and stable Postop Assessment: no signs of nausea or vomiting Anesthetic complications: no    Last Vitals:  Filed Vitals:   03/10/15 0218 03/10/15 0437  BP: 125/71 135/73  Pulse: 71 82  Temp: 36.7 C 36.7 C  Resp: 20 20    Last Pain: There were no vitals filed for this visit.               Effie Berkshire

## 2015-03-11 DIAGNOSIS — N210 Calculus in bladder: Secondary | ICD-10-CM | POA: Diagnosis not present

## 2015-03-12 NOTE — Discharge Summary (Signed)
Physician Discharge Summary  Patient ID: Bradley Lewis MRN: [id number] DOB/AGE: 08-27-1935 79 y.o.  Admit date: 03/09/2015 Discharge date: 03/10/2015  Admission Diagnoses: BPH Discharge Diagnoses:  Active Problems:   BPH (benign prostatic hyperplasia)   Discharged Condition: good  Hospital Course: The patient tolerated the procedure well and was transferred to the floor on IV pain meds, IV fluid. On POD#1 pt was started on regular diet and they ambulated in the halls.Prior to discharge the pt was tolerating a regular diet, pain was controlled on PO pain meds, they were ambulating without difficulty, and they had normal bowel function.   Consults: None  Significant Diagnostic Studies: none  Treatments: surgery: TURP  Discharge Exam: Blood pressure 135/73, pulse 82, temperature 98 F (36.7 C), temperature source Oral, resp. rate 20, height 6\' 4"  (1.93 m), weight 94.802 kg (209 lb), SpO2 93 %. General appearance: alert, cooperative and appears stated age Head: Normocephalic, without obvious abnormality, atraumatic Eyes: conjunctivae/corneas clear. PERRL, EOM's intact. Fundi benign. Neck: no adenopathy, no carotid bruit, no JVD, supple, symmetrical, trachea midline and thyroid not enlarged, symmetric, no tenderness/mass/nodules Resp: clear to auscultation bilaterally Chest wall: no tenderness GI: soft, non-tender; bowel sounds normal; no masses,  no organomegaly Male genitalia: normal Extremities: extremities normal, atraumatic, no cyanosis or edema Neurologic: Alert and oriented X 3, normal strength and tone. Normal symmetric reflexes. Normal coordination and gait  Disposition: 01-Home or Self Care     Medication List    TAKE these medications        CALCIUM + D PO  Take 1 capsule by mouth 2 (two) times daily.     methotrexate 2.5 MG tablet  Commonly known as:  RHEUMATREX  Take 15 mg by mouth once a week. Caution:Chemotherapy. Protect from light.take 6 tablets On  Thursdays.     omeprazole 20 MG capsule  Commonly known as:  PRILOSEC  Take 20 mg by mouth every morning.     oxyCODONE-acetaminophen 5-325 MG tablet  Commonly known as:  ROXICET  Take 1 tablet by mouth every 4 (four) hours as needed for severe pain.     timolol 0.25 % ophthalmic solution  Commonly known as:  BETIMOL  Place 1-2 drops into both eyes 2 (two) times daily.     travoprost (benzalkonium) 0.004 % ophthalmic solution  Commonly known as:  TRAVATAN  Place 1 drop into both eyes at bedtime.     Vitamin D3 5000 UNITS Caps  Take 5 capsules by mouth once a week.           Follow-up Information    Follow up with [facility]. Call on 03/13/2015.   Why:  foley removal   Contact information:   [place]      Signed: Cleon Gustin 03/12/2015, 6:36 PM

## 2015-03-23 ENCOUNTER — Emergency Department (HOSPITAL_COMMUNITY): Payer: Medicare Other

## 2015-03-23 ENCOUNTER — Emergency Department (HOSPITAL_COMMUNITY)
Admission: EM | Admit: 2015-03-23 | Discharge: 2015-03-23 | Payer: Medicare Other | Attending: Emergency Medicine | Admitting: Emergency Medicine

## 2015-03-23 ENCOUNTER — Encounter (HOSPITAL_COMMUNITY): Payer: Self-pay | Admitting: Emergency Medicine

## 2015-03-23 DIAGNOSIS — Y998 Other external cause status: Secondary | ICD-10-CM | POA: Insufficient documentation

## 2015-03-23 DIAGNOSIS — S79911A Unspecified injury of right hip, initial encounter: Secondary | ICD-10-CM | POA: Insufficient documentation

## 2015-03-23 DIAGNOSIS — Z872 Personal history of diseases of the skin and subcutaneous tissue: Secondary | ICD-10-CM | POA: Insufficient documentation

## 2015-03-23 DIAGNOSIS — Y9389 Activity, other specified: Secondary | ICD-10-CM | POA: Diagnosis not present

## 2015-03-23 DIAGNOSIS — S32592D Other specified fracture of left pubis, subsequent encounter for fracture with routine healing: Secondary | ICD-10-CM | POA: Diagnosis not present

## 2015-03-23 DIAGNOSIS — S42402A Unspecified fracture of lower end of left humerus, initial encounter for closed fracture: Secondary | ICD-10-CM | POA: Diagnosis not present

## 2015-03-23 DIAGNOSIS — Z87438 Personal history of other diseases of male genital organs: Secondary | ICD-10-CM | POA: Diagnosis not present

## 2015-03-23 DIAGNOSIS — Y9289 Other specified places as the place of occurrence of the external cause: Secondary | ICD-10-CM | POA: Insufficient documentation

## 2015-03-23 DIAGNOSIS — M84454A Pathological fracture, pelvis, initial encounter for fracture: Secondary | ICD-10-CM | POA: Diagnosis not present

## 2015-03-23 DIAGNOSIS — S32592A Other specified fracture of left pubis, initial encounter for closed fracture: Secondary | ICD-10-CM | POA: Diagnosis not present

## 2015-03-23 DIAGNOSIS — M25551 Pain in right hip: Secondary | ICD-10-CM | POA: Diagnosis not present

## 2015-03-23 DIAGNOSIS — S329XXA Fracture of unspecified parts of lumbosacral spine and pelvis, initial encounter for closed fracture: Secondary | ICD-10-CM

## 2015-03-23 DIAGNOSIS — R269 Unspecified abnormalities of gait and mobility: Secondary | ICD-10-CM | POA: Diagnosis not present

## 2015-03-23 DIAGNOSIS — S32491A Other specified fracture of right acetabulum, initial encounter for closed fracture: Secondary | ICD-10-CM | POA: Diagnosis not present

## 2015-03-23 DIAGNOSIS — Z8781 Personal history of (healed) traumatic fracture: Secondary | ICD-10-CM | POA: Diagnosis not present

## 2015-03-23 DIAGNOSIS — K219 Gastro-esophageal reflux disease without esophagitis: Secondary | ICD-10-CM | POA: Diagnosis not present

## 2015-03-23 DIAGNOSIS — W1839XA Other fall on same level, initial encounter: Secondary | ICD-10-CM | POA: Insufficient documentation

## 2015-03-23 DIAGNOSIS — S32401A Unspecified fracture of right acetabulum, initial encounter for closed fracture: Secondary | ICD-10-CM | POA: Diagnosis not present

## 2015-03-23 DIAGNOSIS — M1990 Unspecified osteoarthritis, unspecified site: Secondary | ICD-10-CM | POA: Diagnosis not present

## 2015-03-23 DIAGNOSIS — L409 Psoriasis, unspecified: Secondary | ICD-10-CM | POA: Diagnosis not present

## 2015-03-23 DIAGNOSIS — M24151 Other articular cartilage disorders, right hip: Secondary | ICD-10-CM | POA: Diagnosis not present

## 2015-03-23 DIAGNOSIS — Z79899 Other long term (current) drug therapy: Secondary | ICD-10-CM | POA: Diagnosis not present

## 2015-03-23 DIAGNOSIS — R0781 Pleurodynia: Secondary | ICD-10-CM | POA: Diagnosis not present

## 2015-03-23 DIAGNOSIS — T148 Other injury of unspecified body region: Secondary | ICD-10-CM | POA: Diagnosis not present

## 2015-03-23 DIAGNOSIS — Z859 Personal history of malignant neoplasm, unspecified: Secondary | ICD-10-CM | POA: Diagnosis not present

## 2015-03-23 DIAGNOSIS — M24152 Other articular cartilage disorders, left hip: Secondary | ICD-10-CM | POA: Diagnosis not present

## 2015-03-23 DIAGNOSIS — H401133 Primary open-angle glaucoma, bilateral, severe stage: Secondary | ICD-10-CM | POA: Diagnosis not present

## 2015-03-23 DIAGNOSIS — S32591A Other specified fracture of right pubis, initial encounter for closed fracture: Secondary | ICD-10-CM | POA: Diagnosis not present

## 2015-03-23 DIAGNOSIS — Z87442 Personal history of urinary calculi: Secondary | ICD-10-CM | POA: Insufficient documentation

## 2015-03-23 DIAGNOSIS — R9431 Abnormal electrocardiogram [ECG] [EKG]: Secondary | ICD-10-CM | POA: Diagnosis not present

## 2015-03-23 DIAGNOSIS — S32434A Nondisplaced fracture of anterior column [iliopubic] of right acetabulum, initial encounter for closed fracture: Secondary | ICD-10-CM | POA: Diagnosis not present

## 2015-03-23 DIAGNOSIS — S52125K Nondisplaced fracture of head of left radius, subsequent encounter for closed fracture with nonunion: Secondary | ICD-10-CM | POA: Diagnosis not present

## 2015-03-23 DIAGNOSIS — Z7901 Long term (current) use of anticoagulants: Secondary | ICD-10-CM | POA: Diagnosis not present

## 2015-03-23 DIAGNOSIS — S3993XA Unspecified injury of pelvis, initial encounter: Secondary | ICD-10-CM | POA: Diagnosis present

## 2015-03-23 DIAGNOSIS — M5136 Other intervertebral disc degeneration, lumbar region: Secondary | ICD-10-CM | POA: Diagnosis not present

## 2015-03-23 DIAGNOSIS — H409 Unspecified glaucoma: Secondary | ICD-10-CM | POA: Insufficient documentation

## 2015-03-23 LAB — CBC WITH DIFFERENTIAL/PLATELET
BASOS ABS: 0 10*3/uL (ref 0.0–0.1)
Basophils Relative: 0 %
EOS ABS: 0 10*3/uL (ref 0.0–0.7)
EOS PCT: 0 %
HCT: 31.9 % — ABNORMAL LOW (ref 39.0–52.0)
Hemoglobin: 10.4 g/dL — ABNORMAL LOW (ref 13.0–17.0)
Lymphocytes Relative: 8 %
Lymphs Abs: 1 10*3/uL (ref 0.7–4.0)
MCH: 32.5 pg (ref 26.0–34.0)
MCHC: 32.6 g/dL (ref 30.0–36.0)
MCV: 99.7 fL (ref 78.0–100.0)
MONO ABS: 0.9 10*3/uL (ref 0.1–1.0)
Monocytes Relative: 8 %
Neutro Abs: 9.7 10*3/uL — ABNORMAL HIGH (ref 1.7–7.7)
Neutrophils Relative %: 84 %
PLATELETS: 197 10*3/uL (ref 150–400)
RBC: 3.2 MIL/uL — AB (ref 4.22–5.81)
RDW: 18.1 % — AB (ref 11.5–15.5)
WBC: 11.6 10*3/uL — AB (ref 4.0–10.5)

## 2015-03-23 LAB — BASIC METABOLIC PANEL
ANION GAP: 7 (ref 5–15)
BUN: 17 mg/dL (ref 6–20)
CALCIUM: 8.2 mg/dL — AB (ref 8.9–10.3)
CO2: 23 mmol/L (ref 22–32)
Chloride: 107 mmol/L (ref 101–111)
Creatinine, Ser: 0.72 mg/dL (ref 0.61–1.24)
GFR calc Af Amer: >60 mL/min (ref >60)
GLUCOSE: 116 mg/dL — AB (ref 65–99)
POTASSIUM: 4.3 mmol/L (ref 3.5–5.1)
SODIUM: 137 mmol/L (ref 135–145)

## 2015-03-23 MED ORDER — ONDANSETRON HCL 4 MG/2ML IJ SOLN
4.0000 mg | Freq: Once | INTRAMUSCULAR | Status: AC
  Administered 2015-03-23: 4 mg via INTRAVENOUS
  Filled 2015-03-23: qty 2

## 2015-03-23 MED ORDER — SODIUM CHLORIDE 0.9 % IV SOLN
INTRAVENOUS | Status: DC
  Administered 2015-03-23: 22:00:00 via INTRAVENOUS

## 2015-03-23 MED ORDER — HYDROMORPHONE HCL 2 MG/ML IJ SOLN
2.0000 mg | Freq: Once | INTRAMUSCULAR | Status: AC
  Administered 2015-03-23: 2 mg via INTRAVENOUS
  Filled 2015-03-23: qty 1

## 2015-03-23 MED ORDER — PROMETHAZINE HCL 25 MG/ML IJ SOLN
12.5000 mg | INTRAMUSCULAR | Status: DC | PRN
  Administered 2015-03-23: 12.5 mg via INTRAVENOUS
  Filled 2015-03-23 (×2): qty 1

## 2015-03-23 MED ORDER — HYDROMORPHONE HCL 1 MG/ML IJ SOLN
1.0000 mg | Freq: Once | INTRAMUSCULAR | Status: AC
  Administered 2015-03-23: 1 mg via INTRAVENOUS
  Filled 2015-03-23: qty 1

## 2015-03-23 NOTE — ED Notes (Signed)
Bed: WA21 Expected date:  Expected time:  Means of arrival:  Comments: EMS- elderly fall, no deformity

## 2015-03-23 NOTE — ED Notes (Signed)
Pt is from home where he had his first fall.  Taking christmas tree out of house and missed a step.  He is c/o Right rear thigh/hip pain.  BP 144/90 P 88 RR 16 98%RA.  Pt denies LOC or striking any other area of his body than the right buttock/hip/upper thigh area.

## 2015-03-23 NOTE — ED Notes (Signed)
Radiology notified of need for fils for Pt transfer

## 2015-03-23 NOTE — ED Provider Notes (Signed)
CSN: [id number]     Arrival date & time 03/23/15  1737 History   First MD Initiated Contact with Patient 03/23/15 1802     Chief Complaint  Patient presents with  . Fall      HPI Pt is from home where he had his first fall. Taking christmas tree out of house and missed a step. He is c/o Right rear thigh/hip pain. BP 144/90 P 88 RR 16 98%RA. Pt denies LOC or striking any other area of his body than the right buttock/hip/upper thigh area. Past Medical History  Diagnosis Date  . Psoriasis   . GERD (gastroesophageal reflux disease)   . Arthritis   . Glaucoma   . Bladder calculi   . BPH (benign prostatic hyperplasia)   . History of urinary retention   . PONV (postoperative nausea and vomiting)    Past Surgical History  Procedure Laterality Date  . Transurethral resection of prostate  11-06-2009  . Colonoscopy    . Radial head arthroplasty Left 04/11/2014    Procedure: LEFT RADIAL HEAD REPLACEMENT ;  Surgeon: Charlotte Crumb, MD;  Location: [facility];  Service: Orthopedics;  Laterality: Left;  . Cataract extraction w/ intraocular lens  implant, bilateral  2010  . Transurethral resection of prostate N/A 03/09/2015    Procedure: TRANSURETHRAL RESECTION OF THE PROSTATE WITH GYRUS INSTRUMENTS;  Surgeon: Cleon Gustin, MD;  Location: [facility];  Service: Urology;  Laterality: N/A;  . Cystoscopy with litholapaxy N/A 03/09/2015    Procedure: CYSTOSCOPY WITH CO2 LITHOLAPAXY;  Surgeon: Cleon Gustin, MD;  Location: [facility];  Service: Urology;  Laterality: N/A;   History reviewed. No pertinent family history. Social History  Substance Use Topics  . Smoking status: Never Smoker   . Smokeless tobacco: Never Used  . Alcohol Use: Yes     Comment: occasional    Review of Systems  All other systems reviewed and are negative.     Allergies  Review of patient's allergies indicates no known allergies.  Home Medications    Prior to Admission medications   Medication Sig Start Date End Date Taking? Authorizing Provider  Calcium Citrate-Vitamin D (CALCIUM + D PO) Take 1 capsule by mouth 2 (two) times daily.   Yes Historical Provider, MD  Cholecalciferol (VITAMIN D3) 5000 UNITS CAPS Take 5 capsules by mouth once a week.   Yes Historical Provider, MD  COMBIGAN 0.2-0.5 % ophthalmic solution Place 1 drop into both eyes 2 (two) times daily. 03/16/15  Yes Historical Provider, MD  ibandronate (BONIVA) 150 MG tablet Take 150 mg by mouth every 30 (thirty) days.  03/04/15  Yes Historical Provider, MD  omeprazole (PRILOSEC) 20 MG capsule Take 20 mg by mouth every morning.    Yes Historical Provider, MD  oxyCODONE-acetaminophen (ROXICET) 5-325 MG tablet Take 1 tablet by mouth every 4 (four) hours as needed for severe pain. 03/09/15  Yes Cleon Gustin, MD  timolol (BETIMOL) 0.25 % ophthalmic solution Place 1-2 drops into both eyes 2 (two) times daily.   Yes Historical Provider, MD  travoprost, benzalkonium, (TRAVATAN) 0.004 % ophthalmic solution Place 1 drop into both eyes at bedtime.   Yes Historical Provider, MD  methotrexate (RHEUMATREX) 2.5 MG tablet Take 15 mg by mouth once a week. Caution:Chemotherapy. Protect from light.take 6 tablets On Thursdays.    Historical Provider, MD   BP 150/87 mmHg  Pulse 88  Temp(Src) 98.3 F (36.8 C) (Oral)  Resp 16  SpO2 97%  Physical Exam  Constitutional: He is oriented to person, place, and time. He appears well-developed and well-nourished. No distress.  HENT:  Head: Normocephalic and atraumatic.  Eyes: Pupils are equal, round, and reactive to light.  Neck: Normal range of motion.  Cardiovascular: Normal rate and intact distal pulses.   Pulmonary/Chest: No respiratory distress.  Abdominal: Normal appearance. He exhibits no distension.  Musculoskeletal:       Right hip: He exhibits decreased range of motion, bony tenderness and deformity.  Neurological: He is alert and  oriented to person, place, and time. No cranial nerve deficit.  Skin: Skin is warm and dry. No rash noted.  Psychiatric: He has a normal mood and affect. His behavior is normal.  Nursing note and vitals reviewed.   ED Course  Procedures (including critical care time) Medications  0.9 %  sodium chloride infusion ( Intravenous New Bag/Given 03/23/15 2154)  promethazine (PHENERGAN) injection 12.5 mg (not administered)  HYDROmorphone (DILAUDID) injection 1 mg (1 mg Intravenous Given 03/23/15 1905)  ondansetron (ZOFRAN) injection 4 mg (4 mg Intravenous Given 03/23/15 1905)  HYDROmorphone (DILAUDID) injection 2 mg (2 mg Intravenous Given 03/23/15 2111)  ondansetron (ZOFRAN) injection 4 mg (4 mg Intravenous Given 03/23/15 2110)    Labs Review Labs Reviewed  CBC WITH DIFFERENTIAL/PLATELET - Abnormal; Notable for the following:    WBC 11.6 (*)    RBC 3.20 (*)    Hemoglobin 10.4 (*)    HCT 31.9 (*)    RDW 18.1 (*)    Neutro Abs 9.7 (*)    All other components within normal limits  BASIC METABOLIC PANEL    Imaging Review Ct Pelvis Wo Contrast  03/23/2015  CLINICAL DATA:  Right hip and iliac wing pain after fall. Fractures on radiograph. EXAM: CT PELVIS WITHOUT CONTRAST TECHNIQUE: Multidetector CT imaging of the pelvis was performed following the standard protocol without intravenous contrast. COMPARISON:  Radiographs earlier this day. Right hip and pelvis MRI 10/03/2014 FINDINGS: Comminuted right-sided pelvic fracture. There is a comminuted both column fracture involving the right acetabulum with multifocal articular surface involvement and displacement involving the lateral and central articular surface. Fracture extends superiorly to involve the iliac wing with transverse component extending to the right sacroiliac joint. Comminuted fracture involvement of the proximal superior pubic ramus. Right inferior pubic ramus fracture in the midportion appears acute. There are remote fractures of the left  superior and inferior pubic ramus, right pubic body, and both sacral ala with scoliosis as seen on prior MRI. Question of acute transverse right sacral fracture at the level of the S1-S2 foramen. Diffuse bony under mineralization. Associated edema and hemorrhage about the right-sided pelvic fractures within the adjacent musculature. Small amount of extraperitoneal hemorrhage tracks about the iliac vasculature on the right. Presacral soft tissue prominence, of uncertain significance. IMPRESSION: 1. Complex right-sided pelvic fracture. Comminuted both column acetabular fracture with intra-articular displacement and displaced extension superiorly through the iliac wing. Transverse iliac bone involvement extends to the sacroiliac joint. Comminuted displaced involvement of the right superior pubic ramus. Acute right inferior pubic ramus fracture. 2. Remote fractures of the sacrum and pubic rami. 3. Soft tissue edema and hemorrhage about the right-sided pelvic fractures. Presacral soft tissue density is of uncertain significance and may reflect sequela of hemorrhage. In the absence of malignancy history, neoplasm or metastasis is felt unlikely. Electronically Signed   By: Jeb Levering M.D.   On: 03/23/2015 20:50   Dg Hip Unilat With Pelvis 2-3 Views Right  03/23/2015  CLINICAL  DATA:  Initial encounter for Right posterior hip pain since 1300 today. Pt states he was taking the christmas tree outside and tripped on the steps at his home and landed on his right hip. Hx arthritis. Pt states previous stress fx of left pelvis. Was not sure of exact location EXAM: DG HIP (WITH OR WITHOUT PELVIS) 2-3V RIGHT COMPARISON:  None. FINDINGS: AP views the pelvis and right hip. Artifact degradation, primarily superiorly. Femoral heads are located. No proximal femur fracture. There is a lucency through the right acetabulum and suggestion of a disruption of the acetabular roof on the second image. Fractures of the right inferior and  superior pubic rami are favored to be acute. There is sclerosis in the parasymphyseal region of the right pubic bone, suggesting chronic injury as well. IMPRESSION: Artifact degraded images. Probable fractures of the right acetabulum and pubic rami. Given limitations of the current exam, the clinical history of prior fracture, CT may be informative for further characterization. Electronically Signed   By: Abigail Miyamoto M.D.   On: 03/23/2015 19:47   I have personally reviewed and evaluated these images and lab results as part of my medical decision-making.   EKG Interpretation   Date/Time:  Monday March 23 2015 21:45:30 EST Ventricular Rate:  93 PR Interval:  174 QRS Duration: 97 QT Interval:  380 QTC Calculation: 473 R Axis:   29 Text Interpretation:  Sinus rhythm Borderline low voltage, extremity leads  Abnormal R-wave progression, early transition Confirmed by Ekam Besson  MD,  Iliana Hutt ([id number]) on 03/23/2015 10:02:52 PM     Discussed with Dr. Dorthula Matas who requested I transfer the patient to [facility] because this was beyond his capabilities. MDM   Final diagnoses:  Acetabular fracture, right, closed, initial encounter  Pelvic fracture, closed, initial encounter        Leonard Schwartz, MD 03/23/15 2203

## 2015-03-23 NOTE — ED Notes (Signed)
[facility] notified and report called to [facility] ED.

## 2015-03-24 DIAGNOSIS — S32434A Nondisplaced fracture of anterior column [iliopubic] of right acetabulum, initial encounter for closed fracture: Secondary | ICD-10-CM | POA: Diagnosis not present

## 2015-03-24 DIAGNOSIS — H409 Unspecified glaucoma: Secondary | ICD-10-CM | POA: Diagnosis not present

## 2015-03-24 DIAGNOSIS — L409 Psoriasis, unspecified: Secondary | ICD-10-CM | POA: Diagnosis not present

## 2015-03-24 DIAGNOSIS — S32592A Other specified fracture of left pubis, initial encounter for closed fracture: Secondary | ICD-10-CM | POA: Diagnosis not present

## 2015-03-24 DIAGNOSIS — W19XXXA Unspecified fall, initial encounter: Secondary | ICD-10-CM | POA: Diagnosis not present

## 2015-03-25 DIAGNOSIS — S32434A Nondisplaced fracture of anterior column [iliopubic] of right acetabulum, initial encounter for closed fracture: Secondary | ICD-10-CM | POA: Diagnosis not present

## 2015-03-25 DIAGNOSIS — L409 Psoriasis, unspecified: Secondary | ICD-10-CM | POA: Diagnosis not present

## 2015-03-25 DIAGNOSIS — W19XXXA Unspecified fall, initial encounter: Secondary | ICD-10-CM | POA: Diagnosis not present

## 2015-03-25 DIAGNOSIS — M8580 Other specified disorders of bone density and structure, unspecified site: Secondary | ICD-10-CM | POA: Diagnosis not present

## 2015-03-25 DIAGNOSIS — H409 Unspecified glaucoma: Secondary | ICD-10-CM | POA: Diagnosis not present

## 2015-03-27 DIAGNOSIS — S32434A Nondisplaced fracture of anterior column [iliopubic] of right acetabulum, initial encounter for closed fracture: Secondary | ICD-10-CM | POA: Diagnosis not present

## 2015-03-27 DIAGNOSIS — M6281 Muscle weakness (generalized): Secondary | ICD-10-CM | POA: Diagnosis not present

## 2015-03-27 DIAGNOSIS — W19XXXA Unspecified fall, initial encounter: Secondary | ICD-10-CM | POA: Diagnosis not present

## 2015-03-28 DIAGNOSIS — S32414D Nondisplaced fracture of anterior wall of right acetabulum, subsequent encounter for fracture with routine healing: Secondary | ICD-10-CM | POA: Diagnosis not present

## 2015-03-28 DIAGNOSIS — S42402D Unspecified fracture of lower end of left humerus, subsequent encounter for fracture with routine healing: Secondary | ICD-10-CM | POA: Diagnosis not present

## 2015-03-30 DIAGNOSIS — S42402D Unspecified fracture of lower end of left humerus, subsequent encounter for fracture with routine healing: Secondary | ICD-10-CM | POA: Diagnosis not present

## 2015-03-30 DIAGNOSIS — S32414D Nondisplaced fracture of anterior wall of right acetabulum, subsequent encounter for fracture with routine healing: Secondary | ICD-10-CM | POA: Diagnosis not present

## 2015-04-01 DIAGNOSIS — S32414D Nondisplaced fracture of anterior wall of right acetabulum, subsequent encounter for fracture with routine healing: Secondary | ICD-10-CM | POA: Diagnosis not present

## 2015-04-01 DIAGNOSIS — S42402D Unspecified fracture of lower end of left humerus, subsequent encounter for fracture with routine healing: Secondary | ICD-10-CM | POA: Diagnosis not present

## 2015-04-06 DIAGNOSIS — S32414D Nondisplaced fracture of anterior wall of right acetabulum, subsequent encounter for fracture with routine healing: Secondary | ICD-10-CM | POA: Diagnosis not present

## 2015-04-06 DIAGNOSIS — S42402D Unspecified fracture of lower end of left humerus, subsequent encounter for fracture with routine healing: Secondary | ICD-10-CM | POA: Diagnosis not present

## 2015-04-07 DIAGNOSIS — S42402D Unspecified fracture of lower end of left humerus, subsequent encounter for fracture with routine healing: Secondary | ICD-10-CM | POA: Diagnosis not present

## 2015-04-07 DIAGNOSIS — S32414D Nondisplaced fracture of anterior wall of right acetabulum, subsequent encounter for fracture with routine healing: Secondary | ICD-10-CM | POA: Diagnosis not present

## 2015-04-08 DIAGNOSIS — S3289XD Fracture of other parts of pelvis, subsequent encounter for fracture with routine healing: Secondary | ICD-10-CM | POA: Diagnosis not present

## 2015-04-08 DIAGNOSIS — S42402D Unspecified fracture of lower end of left humerus, subsequent encounter for fracture with routine healing: Secondary | ICD-10-CM | POA: Diagnosis not present

## 2015-04-08 DIAGNOSIS — S52135D Nondisplaced fracture of neck of left radius, subsequent encounter for closed fracture with routine healing: Secondary | ICD-10-CM | POA: Diagnosis not present

## 2015-04-08 DIAGNOSIS — S32414D Nondisplaced fracture of anterior wall of right acetabulum, subsequent encounter for fracture with routine healing: Secondary | ICD-10-CM | POA: Diagnosis not present

## 2015-04-09 DIAGNOSIS — S42402D Unspecified fracture of lower end of left humerus, subsequent encounter for fracture with routine healing: Secondary | ICD-10-CM | POA: Diagnosis not present

## 2015-04-09 DIAGNOSIS — S32414D Nondisplaced fracture of anterior wall of right acetabulum, subsequent encounter for fracture with routine healing: Secondary | ICD-10-CM | POA: Diagnosis not present

## 2015-04-13 DIAGNOSIS — S42402D Unspecified fracture of lower end of left humerus, subsequent encounter for fracture with routine healing: Secondary | ICD-10-CM | POA: Diagnosis not present

## 2015-04-13 DIAGNOSIS — S32414D Nondisplaced fracture of anterior wall of right acetabulum, subsequent encounter for fracture with routine healing: Secondary | ICD-10-CM | POA: Diagnosis not present

## 2015-04-14 DIAGNOSIS — M160 Bilateral primary osteoarthritis of hip: Secondary | ICD-10-CM | POA: Diagnosis not present

## 2015-04-14 DIAGNOSIS — S3219XD Other fracture of sacrum, subsequent encounter for fracture with routine healing: Secondary | ICD-10-CM | POA: Diagnosis not present

## 2015-04-14 DIAGNOSIS — Z4789 Encounter for other orthopedic aftercare: Secondary | ICD-10-CM | POA: Diagnosis not present

## 2015-04-14 DIAGNOSIS — M5136 Other intervertebral disc degeneration, lumbar region: Secondary | ICD-10-CM | POA: Diagnosis not present

## 2015-04-14 DIAGNOSIS — S32810D Multiple fractures of pelvis with stable disruption of pelvic ring, subsequent encounter for fracture with routine healing: Secondary | ICD-10-CM | POA: Diagnosis not present

## 2015-04-14 DIAGNOSIS — S32462D Displaced associated transverse-posterior fracture of left acetabulum, subsequent encounter for fracture with routine healing: Secondary | ICD-10-CM | POA: Diagnosis not present

## 2015-04-14 DIAGNOSIS — S32401D Unspecified fracture of right acetabulum, subsequent encounter for fracture with routine healing: Secondary | ICD-10-CM | POA: Diagnosis not present

## 2015-04-14 DIAGNOSIS — S32391D Other fracture of right ilium, subsequent encounter for fracture with routine healing: Secondary | ICD-10-CM | POA: Diagnosis not present

## 2015-04-16 DIAGNOSIS — S32414D Nondisplaced fracture of anterior wall of right acetabulum, subsequent encounter for fracture with routine healing: Secondary | ICD-10-CM | POA: Diagnosis not present

## 2015-04-16 DIAGNOSIS — S42402D Unspecified fracture of lower end of left humerus, subsequent encounter for fracture with routine healing: Secondary | ICD-10-CM | POA: Diagnosis not present

## 2015-04-22 DIAGNOSIS — S32414D Nondisplaced fracture of anterior wall of right acetabulum, subsequent encounter for fracture with routine healing: Secondary | ICD-10-CM | POA: Diagnosis not present

## 2015-04-22 DIAGNOSIS — S42402D Unspecified fracture of lower end of left humerus, subsequent encounter for fracture with routine healing: Secondary | ICD-10-CM | POA: Diagnosis not present

## 2015-04-24 DIAGNOSIS — S42402D Unspecified fracture of lower end of left humerus, subsequent encounter for fracture with routine healing: Secondary | ICD-10-CM | POA: Diagnosis not present

## 2015-04-24 DIAGNOSIS — S32414D Nondisplaced fracture of anterior wall of right acetabulum, subsequent encounter for fracture with routine healing: Secondary | ICD-10-CM | POA: Diagnosis not present

## 2015-04-29 DIAGNOSIS — S42402D Unspecified fracture of lower end of left humerus, subsequent encounter for fracture with routine healing: Secondary | ICD-10-CM | POA: Diagnosis not present

## 2015-04-29 DIAGNOSIS — S32414D Nondisplaced fracture of anterior wall of right acetabulum, subsequent encounter for fracture with routine healing: Secondary | ICD-10-CM | POA: Diagnosis not present

## 2015-05-01 DIAGNOSIS — S42402D Unspecified fracture of lower end of left humerus, subsequent encounter for fracture with routine healing: Secondary | ICD-10-CM | POA: Diagnosis not present

## 2015-05-01 DIAGNOSIS — S32414D Nondisplaced fracture of anterior wall of right acetabulum, subsequent encounter for fracture with routine healing: Secondary | ICD-10-CM | POA: Diagnosis not present

## 2015-05-05 DIAGNOSIS — S32414D Nondisplaced fracture of anterior wall of right acetabulum, subsequent encounter for fracture with routine healing: Secondary | ICD-10-CM | POA: Diagnosis not present

## 2015-05-05 DIAGNOSIS — S42402D Unspecified fracture of lower end of left humerus, subsequent encounter for fracture with routine healing: Secondary | ICD-10-CM | POA: Diagnosis not present

## 2015-05-07 DIAGNOSIS — S42402D Unspecified fracture of lower end of left humerus, subsequent encounter for fracture with routine healing: Secondary | ICD-10-CM | POA: Diagnosis not present

## 2015-05-07 DIAGNOSIS — S32414D Nondisplaced fracture of anterior wall of right acetabulum, subsequent encounter for fracture with routine healing: Secondary | ICD-10-CM | POA: Diagnosis not present

## 2015-05-08 DIAGNOSIS — N401 Enlarged prostate with lower urinary tract symptoms: Secondary | ICD-10-CM | POA: Diagnosis not present

## 2015-05-12 DIAGNOSIS — S42402D Unspecified fracture of lower end of left humerus, subsequent encounter for fracture with routine healing: Secondary | ICD-10-CM | POA: Diagnosis not present

## 2015-05-12 DIAGNOSIS — S32414D Nondisplaced fracture of anterior wall of right acetabulum, subsequent encounter for fracture with routine healing: Secondary | ICD-10-CM | POA: Diagnosis not present

## 2015-05-15 DIAGNOSIS — S42402D Unspecified fracture of lower end of left humerus, subsequent encounter for fracture with routine healing: Secondary | ICD-10-CM | POA: Diagnosis not present

## 2015-05-15 DIAGNOSIS — S32414D Nondisplaced fracture of anterior wall of right acetabulum, subsequent encounter for fracture with routine healing: Secondary | ICD-10-CM | POA: Diagnosis not present

## 2015-05-19 DIAGNOSIS — S32491D Other specified fracture of right acetabulum, subsequent encounter for fracture with routine healing: Secondary | ICD-10-CM | POA: Diagnosis not present

## 2015-05-19 DIAGNOSIS — S32462D Displaced associated transverse-posterior fracture of left acetabulum, subsequent encounter for fracture with routine healing: Secondary | ICD-10-CM | POA: Diagnosis not present

## 2015-05-19 DIAGNOSIS — M160 Bilateral primary osteoarthritis of hip: Secondary | ICD-10-CM | POA: Diagnosis not present

## 2015-05-19 DIAGNOSIS — M898X8 Other specified disorders of bone, other site: Secondary | ICD-10-CM | POA: Diagnosis not present

## 2015-05-19 DIAGNOSIS — M533 Sacrococcygeal disorders, not elsewhere classified: Secondary | ICD-10-CM | POA: Diagnosis not present

## 2015-05-19 DIAGNOSIS — M5136 Other intervertebral disc degeneration, lumbar region: Secondary | ICD-10-CM | POA: Diagnosis not present

## 2015-05-19 DIAGNOSIS — Z4789 Encounter for other orthopedic aftercare: Secondary | ICD-10-CM | POA: Diagnosis not present

## 2015-05-19 DIAGNOSIS — S32810D Multiple fractures of pelvis with stable disruption of pelvic ring, subsequent encounter for fracture with routine healing: Secondary | ICD-10-CM | POA: Diagnosis not present

## 2015-05-19 DIAGNOSIS — S3219XD Other fracture of sacrum, subsequent encounter for fracture with routine healing: Secondary | ICD-10-CM | POA: Diagnosis not present

## 2015-05-20 DIAGNOSIS — S32414D Nondisplaced fracture of anterior wall of right acetabulum, subsequent encounter for fracture with routine healing: Secondary | ICD-10-CM | POA: Diagnosis not present

## 2015-05-20 DIAGNOSIS — S42402D Unspecified fracture of lower end of left humerus, subsequent encounter for fracture with routine healing: Secondary | ICD-10-CM | POA: Diagnosis not present

## 2015-05-22 DIAGNOSIS — S42402D Unspecified fracture of lower end of left humerus, subsequent encounter for fracture with routine healing: Secondary | ICD-10-CM | POA: Diagnosis not present

## 2015-05-22 DIAGNOSIS — S32414D Nondisplaced fracture of anterior wall of right acetabulum, subsequent encounter for fracture with routine healing: Secondary | ICD-10-CM | POA: Diagnosis not present

## 2015-05-25 DIAGNOSIS — S32414D Nondisplaced fracture of anterior wall of right acetabulum, subsequent encounter for fracture with routine healing: Secondary | ICD-10-CM | POA: Diagnosis not present

## 2015-05-25 DIAGNOSIS — S42402D Unspecified fracture of lower end of left humerus, subsequent encounter for fracture with routine healing: Secondary | ICD-10-CM | POA: Diagnosis not present

## 2015-05-26 DIAGNOSIS — Z79899 Other long term (current) drug therapy: Secondary | ICD-10-CM | POA: Diagnosis not present

## 2015-05-26 DIAGNOSIS — L570 Actinic keratosis: Secondary | ICD-10-CM | POA: Diagnosis not present

## 2015-05-26 DIAGNOSIS — L820 Inflamed seborrheic keratosis: Secondary | ICD-10-CM | POA: Diagnosis not present

## 2015-05-26 DIAGNOSIS — L400 Psoriasis vulgaris: Secondary | ICD-10-CM | POA: Diagnosis not present

## 2015-05-26 DIAGNOSIS — Z85828 Personal history of other malignant neoplasm of skin: Secondary | ICD-10-CM | POA: Diagnosis not present

## 2015-05-27 DIAGNOSIS — S42402D Unspecified fracture of lower end of left humerus, subsequent encounter for fracture with routine healing: Secondary | ICD-10-CM | POA: Diagnosis not present

## 2015-05-27 DIAGNOSIS — S32414D Nondisplaced fracture of anterior wall of right acetabulum, subsequent encounter for fracture with routine healing: Secondary | ICD-10-CM | POA: Diagnosis not present

## 2015-06-02 DIAGNOSIS — S32414D Nondisplaced fracture of anterior wall of right acetabulum, subsequent encounter for fracture with routine healing: Secondary | ICD-10-CM | POA: Diagnosis not present

## 2015-06-02 DIAGNOSIS — S42402D Unspecified fracture of lower end of left humerus, subsequent encounter for fracture with routine healing: Secondary | ICD-10-CM | POA: Diagnosis not present

## 2015-06-03 DIAGNOSIS — M170 Bilateral primary osteoarthritis of knee: Secondary | ICD-10-CM | POA: Diagnosis not present

## 2015-06-03 DIAGNOSIS — M25562 Pain in left knee: Secondary | ICD-10-CM | POA: Diagnosis not present

## 2015-06-03 DIAGNOSIS — M25561 Pain in right knee: Secondary | ICD-10-CM | POA: Diagnosis not present

## 2015-06-05 DIAGNOSIS — S42402A Unspecified fracture of lower end of left humerus, initial encounter for closed fracture: Secondary | ICD-10-CM | POA: Diagnosis not present

## 2015-06-05 DIAGNOSIS — S42402D Unspecified fracture of lower end of left humerus, subsequent encounter for fracture with routine healing: Secondary | ICD-10-CM | POA: Diagnosis not present

## 2015-06-05 DIAGNOSIS — S32461D Displaced associated transverse-posterior fracture of right acetabulum, subsequent encounter for fracture with routine healing: Secondary | ICD-10-CM | POA: Diagnosis not present

## 2015-06-05 DIAGNOSIS — S32414D Nondisplaced fracture of anterior wall of right acetabulum, subsequent encounter for fracture with routine healing: Secondary | ICD-10-CM | POA: Diagnosis not present

## 2015-06-07 DIAGNOSIS — S32414D Nondisplaced fracture of anterior wall of right acetabulum, subsequent encounter for fracture with routine healing: Secondary | ICD-10-CM | POA: Diagnosis not present

## 2015-06-07 DIAGNOSIS — S42402D Unspecified fracture of lower end of left humerus, subsequent encounter for fracture with routine healing: Secondary | ICD-10-CM | POA: Diagnosis not present

## 2015-06-08 DIAGNOSIS — M25561 Pain in right knee: Secondary | ICD-10-CM | POA: Diagnosis not present

## 2015-06-08 DIAGNOSIS — M1711 Unilateral primary osteoarthritis, right knee: Secondary | ICD-10-CM | POA: Diagnosis not present

## 2015-06-09 DIAGNOSIS — M25562 Pain in left knee: Secondary | ICD-10-CM | POA: Diagnosis not present

## 2015-06-09 DIAGNOSIS — M4686 Other specified inflammatory spondylopathies, lumbar region: Secondary | ICD-10-CM | POA: Diagnosis not present

## 2015-06-09 DIAGNOSIS — M5136 Other intervertebral disc degeneration, lumbar region: Secondary | ICD-10-CM | POA: Diagnosis not present

## 2015-06-09 DIAGNOSIS — L409 Psoriasis, unspecified: Secondary | ICD-10-CM | POA: Diagnosis not present

## 2015-06-09 DIAGNOSIS — R937 Abnormal findings on diagnostic imaging of other parts of musculoskeletal system: Secondary | ICD-10-CM | POA: Diagnosis not present

## 2015-06-09 DIAGNOSIS — M438X6 Other specified deforming dorsopathies, lumbar region: Secondary | ICD-10-CM | POA: Diagnosis not present

## 2015-06-09 DIAGNOSIS — Q2546 Tortuous aortic arch: Secondary | ICD-10-CM | POA: Diagnosis not present

## 2015-06-09 DIAGNOSIS — S32462A Displaced associated transverse-posterior fracture of left acetabulum, initial encounter for closed fracture: Secondary | ICD-10-CM | POA: Diagnosis not present

## 2015-06-09 DIAGNOSIS — Z79899 Other long term (current) drug therapy: Secondary | ICD-10-CM | POA: Diagnosis not present

## 2015-06-09 DIAGNOSIS — Z5181 Encounter for therapeutic drug level monitoring: Secondary | ICD-10-CM | POA: Diagnosis not present

## 2015-06-09 DIAGNOSIS — Z9181 History of falling: Secondary | ICD-10-CM | POA: Diagnosis not present

## 2015-06-09 DIAGNOSIS — M1712 Unilateral primary osteoarthritis, left knee: Secondary | ICD-10-CM | POA: Diagnosis not present

## 2015-06-09 DIAGNOSIS — M810 Age-related osteoporosis without current pathological fracture: Secondary | ICD-10-CM | POA: Diagnosis not present

## 2015-06-10 DIAGNOSIS — H401133 Primary open-angle glaucoma, bilateral, severe stage: Secondary | ICD-10-CM | POA: Diagnosis not present

## 2015-06-10 DIAGNOSIS — S42402D Unspecified fracture of lower end of left humerus, subsequent encounter for fracture with routine healing: Secondary | ICD-10-CM | POA: Diagnosis not present

## 2015-06-10 DIAGNOSIS — S32414D Nondisplaced fracture of anterior wall of right acetabulum, subsequent encounter for fracture with routine healing: Secondary | ICD-10-CM | POA: Diagnosis not present

## 2015-06-16 DIAGNOSIS — S3282XD Multiple fractures of pelvis without disruption of pelvic ring, subsequent encounter for fracture with routine healing: Secondary | ICD-10-CM | POA: Diagnosis not present

## 2015-06-16 DIAGNOSIS — Z4789 Encounter for other orthopedic aftercare: Secondary | ICD-10-CM | POA: Diagnosis not present

## 2015-06-16 DIAGNOSIS — M160 Bilateral primary osteoarthritis of hip: Secondary | ICD-10-CM | POA: Diagnosis not present

## 2015-06-16 DIAGNOSIS — S32591D Other specified fracture of right pubis, subsequent encounter for fracture with routine healing: Secondary | ICD-10-CM | POA: Diagnosis not present

## 2015-06-16 DIAGNOSIS — S52125K Nondisplaced fracture of head of left radius, subsequent encounter for closed fracture with nonunion: Secondary | ICD-10-CM | POA: Diagnosis not present

## 2015-06-16 DIAGNOSIS — S32401D Unspecified fracture of right acetabulum, subsequent encounter for fracture with routine healing: Secondary | ICD-10-CM | POA: Diagnosis not present

## 2015-06-17 DIAGNOSIS — S42402D Unspecified fracture of lower end of left humerus, subsequent encounter for fracture with routine healing: Secondary | ICD-10-CM | POA: Diagnosis not present

## 2015-06-17 DIAGNOSIS — M1711 Unilateral primary osteoarthritis, right knee: Secondary | ICD-10-CM | POA: Diagnosis not present

## 2015-06-17 DIAGNOSIS — S32414D Nondisplaced fracture of anterior wall of right acetabulum, subsequent encounter for fracture with routine healing: Secondary | ICD-10-CM | POA: Diagnosis not present

## 2015-06-17 DIAGNOSIS — M25561 Pain in right knee: Secondary | ICD-10-CM | POA: Diagnosis not present

## 2015-06-18 DIAGNOSIS — M1712 Unilateral primary osteoarthritis, left knee: Secondary | ICD-10-CM | POA: Diagnosis not present

## 2015-06-18 DIAGNOSIS — M25562 Pain in left knee: Secondary | ICD-10-CM | POA: Diagnosis not present

## 2015-06-19 DIAGNOSIS — L570 Actinic keratosis: Secondary | ICD-10-CM | POA: Diagnosis not present

## 2015-06-23 DIAGNOSIS — S32434D Nondisplaced fracture of anterior column [iliopubic] of right acetabulum, subsequent encounter for fracture with routine healing: Secondary | ICD-10-CM | POA: Diagnosis not present

## 2015-06-23 DIAGNOSIS — M25622 Stiffness of left elbow, not elsewhere classified: Secondary | ICD-10-CM | POA: Diagnosis not present

## 2015-06-23 DIAGNOSIS — R29898 Other symptoms and signs involving the musculoskeletal system: Secondary | ICD-10-CM | POA: Diagnosis not present

## 2015-06-23 DIAGNOSIS — G478 Other sleep disorders: Secondary | ICD-10-CM | POA: Diagnosis not present

## 2015-06-24 DIAGNOSIS — M1711 Unilateral primary osteoarthritis, right knee: Secondary | ICD-10-CM | POA: Diagnosis not present

## 2015-06-24 DIAGNOSIS — R29898 Other symptoms and signs involving the musculoskeletal system: Secondary | ICD-10-CM | POA: Diagnosis not present

## 2015-06-24 DIAGNOSIS — G478 Other sleep disorders: Secondary | ICD-10-CM | POA: Diagnosis not present

## 2015-06-24 DIAGNOSIS — S32434D Nondisplaced fracture of anterior column [iliopubic] of right acetabulum, subsequent encounter for fracture with routine healing: Secondary | ICD-10-CM | POA: Diagnosis not present

## 2015-06-24 DIAGNOSIS — M25561 Pain in right knee: Secondary | ICD-10-CM | POA: Diagnosis not present

## 2015-06-24 DIAGNOSIS — M25622 Stiffness of left elbow, not elsewhere classified: Secondary | ICD-10-CM | POA: Diagnosis not present

## 2015-06-25 DIAGNOSIS — M25562 Pain in left knee: Secondary | ICD-10-CM | POA: Diagnosis not present

## 2015-06-25 DIAGNOSIS — M1712 Unilateral primary osteoarthritis, left knee: Secondary | ICD-10-CM | POA: Diagnosis not present

## 2015-06-29 DIAGNOSIS — M25622 Stiffness of left elbow, not elsewhere classified: Secondary | ICD-10-CM | POA: Diagnosis not present

## 2015-06-29 DIAGNOSIS — R29898 Other symptoms and signs involving the musculoskeletal system: Secondary | ICD-10-CM | POA: Diagnosis not present

## 2015-06-29 DIAGNOSIS — G478 Other sleep disorders: Secondary | ICD-10-CM | POA: Diagnosis not present

## 2015-06-29 DIAGNOSIS — S32434D Nondisplaced fracture of anterior column [iliopubic] of right acetabulum, subsequent encounter for fracture with routine healing: Secondary | ICD-10-CM | POA: Diagnosis not present

## 2015-07-01 DIAGNOSIS — M150 Primary generalized (osteo)arthritis: Secondary | ICD-10-CM | POA: Diagnosis not present

## 2015-07-01 DIAGNOSIS — G478 Other sleep disorders: Secondary | ICD-10-CM | POA: Diagnosis not present

## 2015-07-01 DIAGNOSIS — R5383 Other fatigue: Secondary | ICD-10-CM | POA: Diagnosis not present

## 2015-07-01 DIAGNOSIS — M25622 Stiffness of left elbow, not elsewhere classified: Secondary | ICD-10-CM | POA: Diagnosis not present

## 2015-07-01 DIAGNOSIS — L409 Psoriasis, unspecified: Secondary | ICD-10-CM | POA: Diagnosis not present

## 2015-07-01 DIAGNOSIS — S32434D Nondisplaced fracture of anterior column [iliopubic] of right acetabulum, subsequent encounter for fracture with routine healing: Secondary | ICD-10-CM | POA: Diagnosis not present

## 2015-07-01 DIAGNOSIS — R29898 Other symptoms and signs involving the musculoskeletal system: Secondary | ICD-10-CM | POA: Diagnosis not present

## 2015-07-02 DIAGNOSIS — M25562 Pain in left knee: Secondary | ICD-10-CM | POA: Diagnosis not present

## 2015-07-02 DIAGNOSIS — M170 Bilateral primary osteoarthritis of knee: Secondary | ICD-10-CM | POA: Diagnosis not present

## 2015-07-02 DIAGNOSIS — M25561 Pain in right knee: Secondary | ICD-10-CM | POA: Diagnosis not present

## 2015-07-06 DIAGNOSIS — S32434D Nondisplaced fracture of anterior column [iliopubic] of right acetabulum, subsequent encounter for fracture with routine healing: Secondary | ICD-10-CM | POA: Diagnosis not present

## 2015-07-06 DIAGNOSIS — R29898 Other symptoms and signs involving the musculoskeletal system: Secondary | ICD-10-CM | POA: Diagnosis not present

## 2015-07-06 DIAGNOSIS — G478 Other sleep disorders: Secondary | ICD-10-CM | POA: Diagnosis not present

## 2015-07-06 DIAGNOSIS — M25622 Stiffness of left elbow, not elsewhere classified: Secondary | ICD-10-CM | POA: Diagnosis not present

## 2015-07-07 DIAGNOSIS — D649 Anemia, unspecified: Secondary | ICD-10-CM | POA: Diagnosis not present

## 2015-07-07 DIAGNOSIS — Z79899 Other long term (current) drug therapy: Secondary | ICD-10-CM | POA: Diagnosis not present

## 2015-07-07 DIAGNOSIS — L400 Psoriasis vulgaris: Secondary | ICD-10-CM | POA: Diagnosis not present

## 2015-07-08 DIAGNOSIS — D649 Anemia, unspecified: Secondary | ICD-10-CM | POA: Diagnosis not present

## 2015-07-08 DIAGNOSIS — M810 Age-related osteoporosis without current pathological fracture: Secondary | ICD-10-CM | POA: Diagnosis not present

## 2015-07-08 DIAGNOSIS — R600 Localized edema: Secondary | ICD-10-CM | POA: Diagnosis not present

## 2015-07-08 DIAGNOSIS — L409 Psoriasis, unspecified: Secondary | ICD-10-CM | POA: Diagnosis not present

## 2015-07-09 DIAGNOSIS — N3281 Overactive bladder: Secondary | ICD-10-CM | POA: Diagnosis not present

## 2015-07-09 DIAGNOSIS — R350 Frequency of micturition: Secondary | ICD-10-CM | POA: Diagnosis not present

## 2015-07-09 DIAGNOSIS — N401 Enlarged prostate with lower urinary tract symptoms: Secondary | ICD-10-CM | POA: Diagnosis not present

## 2015-07-09 DIAGNOSIS — Z0000 Encounter for general adult medical examination without abnormal findings: Secondary | ICD-10-CM | POA: Diagnosis not present

## 2015-07-09 DIAGNOSIS — R3915 Urgency of urination: Secondary | ICD-10-CM | POA: Diagnosis not present

## 2015-07-10 DIAGNOSIS — R29898 Other symptoms and signs involving the musculoskeletal system: Secondary | ICD-10-CM | POA: Diagnosis not present

## 2015-07-10 DIAGNOSIS — M25622 Stiffness of left elbow, not elsewhere classified: Secondary | ICD-10-CM | POA: Diagnosis not present

## 2015-07-10 DIAGNOSIS — S32434D Nondisplaced fracture of anterior column [iliopubic] of right acetabulum, subsequent encounter for fracture with routine healing: Secondary | ICD-10-CM | POA: Diagnosis not present

## 2015-07-10 DIAGNOSIS — G478 Other sleep disorders: Secondary | ICD-10-CM | POA: Diagnosis not present

## 2015-07-13 DIAGNOSIS — G478 Other sleep disorders: Secondary | ICD-10-CM | POA: Diagnosis not present

## 2015-07-13 DIAGNOSIS — R29898 Other symptoms and signs involving the musculoskeletal system: Secondary | ICD-10-CM | POA: Diagnosis not present

## 2015-07-13 DIAGNOSIS — M25622 Stiffness of left elbow, not elsewhere classified: Secondary | ICD-10-CM | POA: Diagnosis not present

## 2015-07-13 DIAGNOSIS — S32434D Nondisplaced fracture of anterior column [iliopubic] of right acetabulum, subsequent encounter for fracture with routine healing: Secondary | ICD-10-CM | POA: Diagnosis not present

## 2015-07-15 DIAGNOSIS — R29898 Other symptoms and signs involving the musculoskeletal system: Secondary | ICD-10-CM | POA: Diagnosis not present

## 2015-07-15 DIAGNOSIS — G478 Other sleep disorders: Secondary | ICD-10-CM | POA: Diagnosis not present

## 2015-07-15 DIAGNOSIS — M25622 Stiffness of left elbow, not elsewhere classified: Secondary | ICD-10-CM | POA: Diagnosis not present

## 2015-07-15 DIAGNOSIS — S32434D Nondisplaced fracture of anterior column [iliopubic] of right acetabulum, subsequent encounter for fracture with routine healing: Secondary | ICD-10-CM | POA: Diagnosis not present

## 2015-07-16 DIAGNOSIS — R197 Diarrhea, unspecified: Secondary | ICD-10-CM | POA: Diagnosis not present

## 2015-07-20 DIAGNOSIS — S32434D Nondisplaced fracture of anterior column [iliopubic] of right acetabulum, subsequent encounter for fracture with routine healing: Secondary | ICD-10-CM | POA: Diagnosis not present

## 2015-07-20 DIAGNOSIS — R29898 Other symptoms and signs involving the musculoskeletal system: Secondary | ICD-10-CM | POA: Diagnosis not present

## 2015-07-20 DIAGNOSIS — G478 Other sleep disorders: Secondary | ICD-10-CM | POA: Diagnosis not present

## 2015-07-22 DIAGNOSIS — G478 Other sleep disorders: Secondary | ICD-10-CM | POA: Diagnosis not present

## 2015-07-22 DIAGNOSIS — S32434D Nondisplaced fracture of anterior column [iliopubic] of right acetabulum, subsequent encounter for fracture with routine healing: Secondary | ICD-10-CM | POA: Diagnosis not present

## 2015-07-22 DIAGNOSIS — R29898 Other symptoms and signs involving the musculoskeletal system: Secondary | ICD-10-CM | POA: Diagnosis not present

## 2015-07-27 DIAGNOSIS — R600 Localized edema: Secondary | ICD-10-CM | POA: Diagnosis not present

## 2015-07-27 DIAGNOSIS — R29898 Other symptoms and signs involving the musculoskeletal system: Secondary | ICD-10-CM | POA: Diagnosis not present

## 2015-07-27 DIAGNOSIS — G478 Other sleep disorders: Secondary | ICD-10-CM | POA: Diagnosis not present

## 2015-07-27 DIAGNOSIS — S32434D Nondisplaced fracture of anterior column [iliopubic] of right acetabulum, subsequent encounter for fracture with routine healing: Secondary | ICD-10-CM | POA: Diagnosis not present

## 2015-08-03 DIAGNOSIS — S32434D Nondisplaced fracture of anterior column [iliopubic] of right acetabulum, subsequent encounter for fracture with routine healing: Secondary | ICD-10-CM | POA: Diagnosis not present

## 2015-08-03 DIAGNOSIS — G478 Other sleep disorders: Secondary | ICD-10-CM | POA: Diagnosis not present

## 2015-08-03 DIAGNOSIS — R29898 Other symptoms and signs involving the musculoskeletal system: Secondary | ICD-10-CM | POA: Diagnosis not present

## 2015-08-05 DIAGNOSIS — S32434D Nondisplaced fracture of anterior column [iliopubic] of right acetabulum, subsequent encounter for fracture with routine healing: Secondary | ICD-10-CM | POA: Diagnosis not present

## 2015-08-05 DIAGNOSIS — G478 Other sleep disorders: Secondary | ICD-10-CM | POA: Diagnosis not present

## 2015-08-05 DIAGNOSIS — R29898 Other symptoms and signs involving the musculoskeletal system: Secondary | ICD-10-CM | POA: Diagnosis not present

## 2015-08-10 DIAGNOSIS — R29898 Other symptoms and signs involving the musculoskeletal system: Secondary | ICD-10-CM | POA: Diagnosis not present

## 2015-08-10 DIAGNOSIS — S32434D Nondisplaced fracture of anterior column [iliopubic] of right acetabulum, subsequent encounter for fracture with routine healing: Secondary | ICD-10-CM | POA: Diagnosis not present

## 2015-08-10 DIAGNOSIS — G478 Other sleep disorders: Secondary | ICD-10-CM | POA: Diagnosis not present

## 2015-08-12 DIAGNOSIS — G478 Other sleep disorders: Secondary | ICD-10-CM | POA: Diagnosis not present

## 2015-08-12 DIAGNOSIS — S32434D Nondisplaced fracture of anterior column [iliopubic] of right acetabulum, subsequent encounter for fracture with routine healing: Secondary | ICD-10-CM | POA: Diagnosis not present

## 2015-08-12 DIAGNOSIS — R29898 Other symptoms and signs involving the musculoskeletal system: Secondary | ICD-10-CM | POA: Diagnosis not present

## 2015-08-18 DIAGNOSIS — S32434D Nondisplaced fracture of anterior column [iliopubic] of right acetabulum, subsequent encounter for fracture with routine healing: Secondary | ICD-10-CM | POA: Diagnosis not present

## 2015-08-18 DIAGNOSIS — G478 Other sleep disorders: Secondary | ICD-10-CM | POA: Diagnosis not present

## 2015-08-18 DIAGNOSIS — R29898 Other symptoms and signs involving the musculoskeletal system: Secondary | ICD-10-CM | POA: Diagnosis not present

## 2015-08-20 DIAGNOSIS — R351 Nocturia: Secondary | ICD-10-CM | POA: Diagnosis not present

## 2015-08-20 DIAGNOSIS — N401 Enlarged prostate with lower urinary tract symptoms: Secondary | ICD-10-CM | POA: Diagnosis not present

## 2015-08-20 DIAGNOSIS — R3915 Urgency of urination: Secondary | ICD-10-CM | POA: Diagnosis not present

## 2015-08-20 DIAGNOSIS — N3281 Overactive bladder: Secondary | ICD-10-CM | POA: Diagnosis not present

## 2015-08-24 DIAGNOSIS — S32434D Nondisplaced fracture of anterior column [iliopubic] of right acetabulum, subsequent encounter for fracture with routine healing: Secondary | ICD-10-CM | POA: Diagnosis not present

## 2015-08-24 DIAGNOSIS — G478 Other sleep disorders: Secondary | ICD-10-CM | POA: Diagnosis not present

## 2015-08-24 DIAGNOSIS — R29898 Other symptoms and signs involving the musculoskeletal system: Secondary | ICD-10-CM | POA: Diagnosis not present

## 2015-08-26 DIAGNOSIS — G478 Other sleep disorders: Secondary | ICD-10-CM | POA: Diagnosis not present

## 2015-08-26 DIAGNOSIS — R29898 Other symptoms and signs involving the musculoskeletal system: Secondary | ICD-10-CM | POA: Diagnosis not present

## 2015-08-26 DIAGNOSIS — S32434D Nondisplaced fracture of anterior column [iliopubic] of right acetabulum, subsequent encounter for fracture with routine healing: Secondary | ICD-10-CM | POA: Diagnosis not present

## 2015-08-31 DIAGNOSIS — S32434D Nondisplaced fracture of anterior column [iliopubic] of right acetabulum, subsequent encounter for fracture with routine healing: Secondary | ICD-10-CM | POA: Diagnosis not present

## 2015-08-31 DIAGNOSIS — G478 Other sleep disorders: Secondary | ICD-10-CM | POA: Diagnosis not present

## 2015-08-31 DIAGNOSIS — R29898 Other symptoms and signs involving the musculoskeletal system: Secondary | ICD-10-CM | POA: Diagnosis not present

## 2015-09-02 DIAGNOSIS — G478 Other sleep disorders: Secondary | ICD-10-CM | POA: Diagnosis not present

## 2015-09-02 DIAGNOSIS — R29898 Other symptoms and signs involving the musculoskeletal system: Secondary | ICD-10-CM | POA: Diagnosis not present

## 2015-09-02 DIAGNOSIS — S32434D Nondisplaced fracture of anterior column [iliopubic] of right acetabulum, subsequent encounter for fracture with routine healing: Secondary | ICD-10-CM | POA: Diagnosis not present

## 2015-09-03 DIAGNOSIS — D649 Anemia, unspecified: Secondary | ICD-10-CM | POA: Diagnosis not present

## 2015-09-07 DIAGNOSIS — R29898 Other symptoms and signs involving the musculoskeletal system: Secondary | ICD-10-CM | POA: Diagnosis not present

## 2015-09-07 DIAGNOSIS — G478 Other sleep disorders: Secondary | ICD-10-CM | POA: Diagnosis not present

## 2015-09-07 DIAGNOSIS — S32434D Nondisplaced fracture of anterior column [iliopubic] of right acetabulum, subsequent encounter for fracture with routine healing: Secondary | ICD-10-CM | POA: Diagnosis not present

## 2015-09-08 DIAGNOSIS — Z79899 Other long term (current) drug therapy: Secondary | ICD-10-CM | POA: Diagnosis not present

## 2015-09-08 DIAGNOSIS — Z5181 Encounter for therapeutic drug level monitoring: Secondary | ICD-10-CM | POA: Diagnosis not present

## 2015-09-08 DIAGNOSIS — M810 Age-related osteoporosis without current pathological fracture: Secondary | ICD-10-CM | POA: Diagnosis not present

## 2015-09-08 DIAGNOSIS — L409 Psoriasis, unspecified: Secondary | ICD-10-CM | POA: Diagnosis not present

## 2015-09-08 DIAGNOSIS — Z9181 History of falling: Secondary | ICD-10-CM | POA: Diagnosis not present

## 2015-09-09 DIAGNOSIS — Z85828 Personal history of other malignant neoplasm of skin: Secondary | ICD-10-CM | POA: Diagnosis not present

## 2015-09-09 DIAGNOSIS — R21 Rash and other nonspecific skin eruption: Secondary | ICD-10-CM | POA: Diagnosis not present

## 2015-09-09 DIAGNOSIS — D649 Anemia, unspecified: Secondary | ICD-10-CM | POA: Diagnosis not present

## 2015-09-09 DIAGNOSIS — L0100 Impetigo, unspecified: Secondary | ICD-10-CM | POA: Diagnosis not present

## 2015-09-09 DIAGNOSIS — R600 Localized edema: Secondary | ICD-10-CM | POA: Diagnosis not present

## 2015-09-09 DIAGNOSIS — L0889 Other specified local infections of the skin and subcutaneous tissue: Secondary | ICD-10-CM | POA: Diagnosis not present

## 2015-09-09 DIAGNOSIS — S52122S Displaced fracture of head of left radius, sequela: Secondary | ICD-10-CM | POA: Diagnosis not present

## 2015-09-09 DIAGNOSIS — L400 Psoriasis vulgaris: Secondary | ICD-10-CM | POA: Diagnosis not present

## 2015-09-09 DIAGNOSIS — D696 Thrombocytopenia, unspecified: Secondary | ICD-10-CM | POA: Diagnosis not present

## 2015-09-11 DIAGNOSIS — M25531 Pain in right wrist: Secondary | ICD-10-CM | POA: Diagnosis not present

## 2015-09-14 ENCOUNTER — Ambulatory Visit (INDEPENDENT_AMBULATORY_CARE_PROVIDER_SITE_OTHER): Payer: Medicare Other | Admitting: Podiatry

## 2015-09-14 ENCOUNTER — Encounter: Payer: Self-pay | Admitting: Podiatry

## 2015-09-14 DIAGNOSIS — L03032 Cellulitis of left toe: Secondary | ICD-10-CM

## 2015-09-14 DIAGNOSIS — L03012 Cellulitis of left finger: Secondary | ICD-10-CM

## 2015-09-15 NOTE — Progress Notes (Signed)
Subjective:     Patient ID: Bradley Lewis, male   DOB: 1935/11/09, 80 y.o.   MRN: [id number]  HPI patient presents with breakdown of tissue plantar aspect left big toe with large blister formation that has been painful when he tries to walk on it   Review of Systems     Objective:   Physical Exam Neurovascular status intact muscle strength adequate range of motion within normal limits with patient found to have breakdown of tissue plantar aspect left hallux localized in nature with no proximal edema erythema or drainage noted at this time and no systemic indications of infection    Assessment:     Appears to be localized abscess process left hallux    Plan:     H&P and condition reviewed and at this time I did a proximal block of the area  sterile prep was applied and went ahead and opened up the abscess drained a clear fluid and applied Silvadene under occlusion with instructions on soaks. If any redness or any other issues were to occur patient is to come in or let us know immediately

## 2015-09-16 DIAGNOSIS — G478 Other sleep disorders: Secondary | ICD-10-CM | POA: Diagnosis not present

## 2015-09-16 DIAGNOSIS — R29898 Other symptoms and signs involving the musculoskeletal system: Secondary | ICD-10-CM | POA: Diagnosis not present

## 2015-09-16 DIAGNOSIS — S32434D Nondisplaced fracture of anterior column [iliopubic] of right acetabulum, subsequent encounter for fracture with routine healing: Secondary | ICD-10-CM | POA: Diagnosis not present

## 2015-09-23 DIAGNOSIS — G478 Other sleep disorders: Secondary | ICD-10-CM | POA: Diagnosis not present

## 2015-09-23 DIAGNOSIS — S32434D Nondisplaced fracture of anterior column [iliopubic] of right acetabulum, subsequent encounter for fracture with routine healing: Secondary | ICD-10-CM | POA: Diagnosis not present

## 2015-09-23 DIAGNOSIS — R29898 Other symptoms and signs involving the musculoskeletal system: Secondary | ICD-10-CM | POA: Diagnosis not present

## 2015-09-25 DIAGNOSIS — T814XXA Infection following a procedure, initial encounter: Secondary | ICD-10-CM | POA: Diagnosis not present

## 2015-09-29 DIAGNOSIS — M25531 Pain in right wrist: Secondary | ICD-10-CM | POA: Diagnosis not present

## 2015-10-02 DIAGNOSIS — M21612 Bunion of left foot: Secondary | ICD-10-CM | POA: Diagnosis not present

## 2015-10-05 DIAGNOSIS — Z79899 Other long term (current) drug therapy: Secondary | ICD-10-CM | POA: Diagnosis not present

## 2015-10-19 DIAGNOSIS — L0390 Cellulitis, unspecified: Secondary | ICD-10-CM | POA: Diagnosis not present

## 2015-10-21 DIAGNOSIS — D696 Thrombocytopenia, unspecified: Secondary | ICD-10-CM | POA: Diagnosis not present

## 2015-10-21 DIAGNOSIS — D649 Anemia, unspecified: Secondary | ICD-10-CM | POA: Diagnosis not present

## 2015-10-26 DIAGNOSIS — R29898 Other symptoms and signs involving the musculoskeletal system: Secondary | ICD-10-CM | POA: Diagnosis not present

## 2015-10-26 DIAGNOSIS — S32434D Nondisplaced fracture of anterior column [iliopubic] of right acetabulum, subsequent encounter for fracture with routine healing: Secondary | ICD-10-CM | POA: Diagnosis not present

## 2015-10-29 DIAGNOSIS — D649 Anemia, unspecified: Secondary | ICD-10-CM | POA: Diagnosis not present

## 2015-10-29 DIAGNOSIS — D696 Thrombocytopenia, unspecified: Secondary | ICD-10-CM | POA: Diagnosis not present

## 2015-10-29 DIAGNOSIS — D500 Iron deficiency anemia secondary to blood loss (chronic): Secondary | ICD-10-CM | POA: Diagnosis not present

## 2015-11-02 DIAGNOSIS — S32434D Nondisplaced fracture of anterior column [iliopubic] of right acetabulum, subsequent encounter for fracture with routine healing: Secondary | ICD-10-CM | POA: Diagnosis not present

## 2015-11-02 DIAGNOSIS — R29898 Other symptoms and signs involving the musculoskeletal system: Secondary | ICD-10-CM | POA: Diagnosis not present

## 2015-11-06 DIAGNOSIS — H401133 Primary open-angle glaucoma, bilateral, severe stage: Secondary | ICD-10-CM | POA: Diagnosis not present

## 2015-11-09 DIAGNOSIS — S32434D Nondisplaced fracture of anterior column [iliopubic] of right acetabulum, subsequent encounter for fracture with routine healing: Secondary | ICD-10-CM | POA: Diagnosis not present

## 2015-11-09 DIAGNOSIS — R29898 Other symptoms and signs involving the musculoskeletal system: Secondary | ICD-10-CM | POA: Diagnosis not present

## 2015-11-17 DIAGNOSIS — R29898 Other symptoms and signs involving the musculoskeletal system: Secondary | ICD-10-CM | POA: Diagnosis not present

## 2015-11-17 DIAGNOSIS — S32434D Nondisplaced fracture of anterior column [iliopubic] of right acetabulum, subsequent encounter for fracture with routine healing: Secondary | ICD-10-CM | POA: Diagnosis not present

## 2015-11-19 ENCOUNTER — Other Ambulatory Visit: Payer: Self-pay

## 2015-11-25 DIAGNOSIS — Z79899 Other long term (current) drug therapy: Secondary | ICD-10-CM | POA: Diagnosis not present

## 2015-11-25 DIAGNOSIS — S32434D Nondisplaced fracture of anterior column [iliopubic] of right acetabulum, subsequent encounter for fracture with routine healing: Secondary | ICD-10-CM | POA: Diagnosis not present

## 2015-11-25 DIAGNOSIS — M25551 Pain in right hip: Secondary | ICD-10-CM | POA: Diagnosis not present

## 2015-11-25 DIAGNOSIS — G478 Other sleep disorders: Secondary | ICD-10-CM | POA: Diagnosis not present

## 2015-11-30 DIAGNOSIS — L400 Psoriasis vulgaris: Secondary | ICD-10-CM | POA: Diagnosis not present

## 2015-11-30 DIAGNOSIS — Z85828 Personal history of other malignant neoplasm of skin: Secondary | ICD-10-CM | POA: Diagnosis not present

## 2015-11-30 DIAGNOSIS — L821 Other seborrheic keratosis: Secondary | ICD-10-CM | POA: Diagnosis not present

## 2015-11-30 DIAGNOSIS — C44319 Basal cell carcinoma of skin of other parts of face: Secondary | ICD-10-CM | POA: Diagnosis not present

## 2015-11-30 DIAGNOSIS — D485 Neoplasm of uncertain behavior of skin: Secondary | ICD-10-CM | POA: Diagnosis not present

## 2015-11-30 DIAGNOSIS — L570 Actinic keratosis: Secondary | ICD-10-CM | POA: Diagnosis not present

## 2015-11-30 DIAGNOSIS — Z79899 Other long term (current) drug therapy: Secondary | ICD-10-CM | POA: Diagnosis not present

## 2015-12-14 DIAGNOSIS — Z85828 Personal history of other malignant neoplasm of skin: Secondary | ICD-10-CM | POA: Diagnosis not present

## 2015-12-14 DIAGNOSIS — C44319 Basal cell carcinoma of skin of other parts of face: Secondary | ICD-10-CM | POA: Diagnosis not present

## 2015-12-24 DIAGNOSIS — S32810D Multiple fractures of pelvis with stable disruption of pelvic ring, subsequent encounter for fracture with routine healing: Secondary | ICD-10-CM | POA: Diagnosis not present

## 2015-12-24 DIAGNOSIS — S3210XD Unspecified fracture of sacrum, subsequent encounter for fracture with routine healing: Secondary | ICD-10-CM | POA: Diagnosis not present

## 2015-12-24 DIAGNOSIS — S32461D Displaced associated transverse-posterior fracture of right acetabulum, subsequent encounter for fracture with routine healing: Secondary | ICD-10-CM | POA: Diagnosis not present

## 2015-12-24 DIAGNOSIS — S32401D Unspecified fracture of right acetabulum, subsequent encounter for fracture with routine healing: Secondary | ICD-10-CM | POA: Diagnosis not present

## 2016-01-04 DIAGNOSIS — H401133 Primary open-angle glaucoma, bilateral, severe stage: Secondary | ICD-10-CM | POA: Diagnosis not present

## 2016-01-07 DIAGNOSIS — M25561 Pain in right knee: Secondary | ICD-10-CM | POA: Diagnosis not present

## 2016-01-07 DIAGNOSIS — M25562 Pain in left knee: Secondary | ICD-10-CM | POA: Diagnosis not present

## 2016-01-07 DIAGNOSIS — M170 Bilateral primary osteoarthritis of knee: Secondary | ICD-10-CM | POA: Diagnosis not present

## 2016-01-27 DIAGNOSIS — D500 Iron deficiency anemia secondary to blood loss (chronic): Secondary | ICD-10-CM | POA: Diagnosis not present

## 2016-01-27 DIAGNOSIS — Z79899 Other long term (current) drug therapy: Secondary | ICD-10-CM | POA: Diagnosis not present

## 2016-01-27 DIAGNOSIS — D696 Thrombocytopenia, unspecified: Secondary | ICD-10-CM | POA: Diagnosis not present

## 2016-01-27 DIAGNOSIS — R5383 Other fatigue: Secondary | ICD-10-CM | POA: Diagnosis not present

## 2016-02-02 DIAGNOSIS — D649 Anemia, unspecified: Secondary | ICD-10-CM | POA: Diagnosis not present

## 2016-02-03 DIAGNOSIS — D649 Anemia, unspecified: Secondary | ICD-10-CM | POA: Diagnosis not present

## 2016-02-03 DIAGNOSIS — D696 Thrombocytopenia, unspecified: Secondary | ICD-10-CM | POA: Diagnosis not present

## 2016-02-03 DIAGNOSIS — D72819 Decreased white blood cell count, unspecified: Secondary | ICD-10-CM | POA: Diagnosis not present

## 2016-02-03 DIAGNOSIS — Z23 Encounter for immunization: Secondary | ICD-10-CM | POA: Diagnosis not present

## 2016-02-03 DIAGNOSIS — R5383 Other fatigue: Secondary | ICD-10-CM | POA: Diagnosis not present

## 2016-02-09 ENCOUNTER — Telehealth: Payer: Self-pay | Admitting: Hematology

## 2016-02-09 ENCOUNTER — Encounter: Payer: Self-pay | Admitting: Hematology

## 2016-02-09 NOTE — Telephone Encounter (Signed)
Referring provider requested appt and will contact pt regarding appt date/time.

## 2016-03-25 ENCOUNTER — Encounter: Payer: BLUE CROSS/BLUE SHIELD | Admitting: Hematology

## 2016-04-04 DIAGNOSIS — L400 Psoriasis vulgaris: Secondary | ICD-10-CM | POA: Diagnosis not present

## 2016-04-04 DIAGNOSIS — L570 Actinic keratosis: Secondary | ICD-10-CM | POA: Diagnosis not present

## 2016-04-04 DIAGNOSIS — Z79899 Other long term (current) drug therapy: Secondary | ICD-10-CM | POA: Diagnosis not present

## 2016-04-04 DIAGNOSIS — Z85828 Personal history of other malignant neoplasm of skin: Secondary | ICD-10-CM | POA: Diagnosis not present

## 2016-04-05 ENCOUNTER — Ambulatory Visit (HOSPITAL_BASED_OUTPATIENT_CLINIC_OR_DEPARTMENT_OTHER): Payer: Medicare Other

## 2016-04-05 ENCOUNTER — Encounter: Payer: Self-pay | Admitting: Hematology

## 2016-04-05 ENCOUNTER — Ambulatory Visit (HOSPITAL_BASED_OUTPATIENT_CLINIC_OR_DEPARTMENT_OTHER): Payer: Medicare Other | Admitting: Hematology

## 2016-04-05 ENCOUNTER — Telehealth: Payer: Self-pay | Admitting: Hematology

## 2016-04-05 VITALS — BP 109/59 | HR 61 | Temp 97.8°F | Resp 18 | Ht 76.0 in | Wt 195.6 lb

## 2016-04-05 DIAGNOSIS — D649 Anemia, unspecified: Secondary | ICD-10-CM

## 2016-04-05 DIAGNOSIS — D696 Thrombocytopenia, unspecified: Secondary | ICD-10-CM | POA: Diagnosis not present

## 2016-04-05 DIAGNOSIS — D539 Nutritional anemia, unspecified: Secondary | ICD-10-CM

## 2016-04-05 DIAGNOSIS — D61818 Other pancytopenia: Secondary | ICD-10-CM | POA: Diagnosis not present

## 2016-04-05 DIAGNOSIS — D72819 Decreased white blood cell count, unspecified: Secondary | ICD-10-CM

## 2016-04-05 LAB — CBC & DIFF AND RETIC
BASO%: 0.2 % (ref 0.0–2.0)
BASOS ABS: 0 10*3/uL (ref 0.0–0.1)
EOS ABS: 0 10*3/uL (ref 0.0–0.5)
EOS%: 0.9 % (ref 0.0–7.0)
HCT: 32.8 % — ABNORMAL LOW (ref 38.4–49.9)
HEMOGLOBIN: 10.6 g/dL — AB (ref 13.0–17.1)
IMMATURE RETIC FRACT: 11 % — AB (ref 3.00–10.60)
LYMPH%: 33.1 % (ref 14.0–49.0)
MCH: 32 pg (ref 27.2–33.4)
MCHC: 32.3 g/dL (ref 32.0–36.0)
MCV: 99.1 fL — ABNORMAL HIGH (ref 79.3–98.0)
MONO#: 0.4 10*3/uL (ref 0.1–0.9)
MONO%: 9.2 % (ref 0.0–14.0)
NEUT%: 56.6 % (ref 39.0–75.0)
NEUTROS ABS: 2.5 10*3/uL (ref 1.5–6.5)
NRBC: 0 % (ref 0–0)
Platelets: 134 10*3/uL — ABNORMAL LOW (ref 140–400)
RBC: 3.31 10*6/uL — AB (ref 4.20–5.82)
RDW: 16.6 % — AB (ref 11.0–14.6)
RETIC CT ABS: 34.42 10*3/uL — AB (ref 34.80–93.90)
Retic %: 1.04 % (ref 0.80–1.80)
WBC: 4.4 10*3/uL (ref 4.0–10.3)
lymph#: 1.5 10*3/uL (ref 0.9–3.3)

## 2016-04-05 LAB — COMPREHENSIVE METABOLIC PANEL
ALBUMIN: 3.8 g/dL (ref 3.5–5.0)
ALK PHOS: 87 U/L (ref 40–150)
ALT: 15 U/L (ref 0–55)
AST: 26 U/L (ref 5–34)
Anion Gap: 9 mEq/L (ref 3–11)
BILIRUBIN TOTAL: 0.81 mg/dL (ref 0.20–1.20)
BUN: 21.4 mg/dL (ref 7.0–26.0)
CO2: 25 meq/L (ref 22–29)
Calcium: 9.4 mg/dL (ref 8.4–10.4)
Chloride: 107 mEq/L (ref 98–109)
Creatinine: 1.2 mg/dL (ref 0.7–1.3)
EGFR: 60 mL/min/{1.73_m2} — ABNORMAL LOW (ref >90)
GLUCOSE: 94 mg/dL (ref 70–140)
Potassium: 4.1 mEq/L (ref 3.5–5.1)
SODIUM: 141 meq/L (ref 136–145)
TOTAL PROTEIN: 6.7 g/dL (ref 6.4–8.3)

## 2016-04-05 LAB — CHCC SMEAR

## 2016-04-05 LAB — LACTATE DEHYDROGENASE: LDH: 204 U/L (ref 125–245)

## 2016-04-05 NOTE — Progress Notes (Signed)
Marland Kitchen    HEMATOLOGY/ONCOLOGY CONSULTATION NOTE  Date of Service: 04/05/2016  PCP: Merrilee Seashore MD  CHIEF COMPLAINTS/PURPOSE OF CONSULTATION:  Anemia and thrombocytopenia and leukopenia   HISTORY OF PRESENTING ILLNESS:  Bradley Lewis is a wonderful 81 y.o. male who has been referred to [place] by Dr Merrilee Seashore MD for evaluation and management of anemia and thrombocytopenia.  Patient has a history of hypertension, significant psoriasis with psoriatic arthritis on chronic methotrexate therapy for 30 years by his dermatologist Dr. Ronnald Ramp, osteoporosis (on Forteo since April 2017), BPH, osteoarthritis, vitamin D deficiency.  Patient was seen by his primary care physician in November 2017 and had labs on 01/27/2016 that showed a hemoglobin level of 9.2 with an MCV of 99, platelet count of 91,000 and WBC count of 3.6k.  Review of outside labs suggest that his platelet, anemia and leukopenia counts have been fluctuating with time since 2016. This could be likely related to his methotrexate dosing or autoimmune physiology related to his significant psoriasis.  Patient had iron studies which showed iron saturation of 18%. Ferritin level not available. He notes he was started on iron pills by his primary care physician. Serum folic acid levels were noted to be 13.2.  Patient reports that he has been on oral iron for 3 months now and his methotrexate dose has been reduced from 12.5 down to 10 mg weekly since his labs.  Patient notes no overt bleeding. Energy levels are somewhat low with some mild fatigue. He notes that he is working on physical therapy to try to recover from his pelvic fracture so that he can get back to his golf.  No recent infections. He does not note any other new medications. Notes that he does take folic acid 161 g daily.   MEDICAL HISTORY:  Past Medical History:  Diagnosis Date  . Arthritis   . Bladder calculi   . BPH (benign prostatic hyperplasia)   . GERD  (gastroesophageal reflux disease)   . Glaucoma   . History of urinary retention   . PONV (postoperative nausea and vomiting)   . Psoriasis     SURGICAL HISTORY: Past Surgical History:  Procedure Laterality Date  . CATARACT EXTRACTION W/ INTRAOCULAR LENS  IMPLANT, BILATERAL  2010  . COLONOSCOPY    . CYSTOSCOPY WITH LITHOLAPAXY N/A 03/09/2015   Procedure: CYSTOSCOPY WITH CO2 LITHOLAPAXY;  Surgeon: Cleon Gustin, MD;  Location: [facility];  Service: Urology;  Laterality: N/A;  . RADIAL HEAD ARTHROPLASTY Left 04/11/2014   Procedure: LEFT RADIAL HEAD REPLACEMENT ;  Surgeon: Charlotte Crumb, MD;  Location: [facility];  Service: Orthopedics;  Laterality: Left;  . TRANSURETHRAL RESECTION OF PROSTATE  11-06-2009  . TRANSURETHRAL RESECTION OF PROSTATE N/A 03/09/2015   Procedure: TRANSURETHRAL RESECTION OF THE PROSTATE WITH GYRUS INSTRUMENTS;  Surgeon: Cleon Gustin, MD;  Location: [facility];  Service: Urology;  Laterality: N/A;   History of liver biopsy 3 with the last one done in 2006- patient reports for evaluation of abnormal liver function tests in the setting of methotrexate use. Left radial head fracture in January 2016  SOCIAL HISTORY: Social History   Social History  . Marital status: Single    Spouse name: N/A  . Number of children: N/A  . Years of education: N/A   Occupational History  . Not on file.   Social History Main Topics  . Smoking status: Never Smoker  . Smokeless tobacco: Never Used  . Alcohol use Yes  Comment: occasional  . Drug use: No  . Sexual activity: Not on file   Other Topics Concern  . Not on file   Social History Narrative  . No narrative on file  Patient notes he works out regularly and plays golf Divorced and currently lives alone.  FAMILY HISTORY: Father- was a smoker had lung cancer Mother-ovarian cancer  ALLERGIES:  has No Known Allergies.  MEDICATIONS:  Current Outpatient  Prescriptions  Medication Sig Dispense Refill  . Calcium Citrate-Vitamin D (CALCIUM + D PO) Take 1 capsule by mouth 2 (two) times daily.    . cephALEXin (KEFLEX) 500 MG capsule Take 500 mg by mouth 4 (four) times daily.    . Cholecalciferol (VITAMIN D3) 5000 UNITS CAPS Take 5 capsules by mouth once a week.    . COMBIGAN 0.2-0.5 % ophthalmic solution Place 1 drop into both eyes 2 (two) times daily.  11  . ibandronate (BONIVA) 150 MG tablet Take 150 mg by mouth every 30 (thirty) days.   5  . methotrexate (RHEUMATREX) 2.5 MG tablet Take 15 mg by mouth once a week. Caution:Chemotherapy. Protect from light.take 6 tablets On Thursdays.    Marland Kitchen omeprazole (PRILOSEC) 20 MG capsule Take 20 mg by mouth every morning.     Marland Kitchen oxyCODONE-acetaminophen (ROXICET) 5-325 MG tablet Take 1 tablet by mouth every 4 (four) hours as needed for severe pain. 30 tablet 0  . timolol (BETIMOL) 0.25 % ophthalmic solution Place 1-2 drops into both eyes 2 (two) times daily.    . travoprost, benzalkonium, (TRAVATAN) 0.004 % ophthalmic solution Place 1 drop into both eyes at bedtime.     No current facility-administered medications for this visit.     REVIEW OF SYSTEMS:    10 Point review of Systems was done is negative except as noted above.  PHYSICAL EXAMINATION: ECOG PERFORMANCE STATUS: 1 - Symptomatic but completely ambulatory  . Vitals:   04/05/16 1355  BP: (!) 109/59  Pulse: 61  Resp: 18  Temp: 97.8 F (36.6 C)   Filed Weights   04/05/16 1355  Weight: 195 lb 9.6 oz (88.7 kg)   .Body mass index is 23.81 kg/m.  GENERAL:alert, in no acute distress and comfortable SKIN: psoriasis + EYES: normal, conjunctiva are pink and non-injected, sclera clear OROPHARYNX:no exudate, no erythema and lips, buccal mucosa, and tongue normal  NECK: supple, no JVD, thyroid normal size, non-tender, without nodularity LYMPH:  no palpable lymphadenopathy in the cervical, axillary or inguinal LUNGS: clear to auscultation with  normal respiratory effort HEART: regular rate & rhythm,  no murmurs and no lower extremity edema ABDOMEN: abdomen soft, non-tender, normoactive bowel sounds  Musculoskeletal: no cyanosis of digits and no clubbing  PSYCH: alert & oriented x 3 with fluent speech NEURO: no focal motor/sensory deficits  LABORATORY DATA:  I have reviewed the data as listed  . CBC Latest Ref Rng & Units 04/05/2016 04/05/2016 03/23/2015  WBC 4.0 - 10.3 10e3/uL 4.4 - 11.6(H)  Hemoglobin 13.0 - 17.1 g/dL 10.6(L) - 10.4(L)  Hematocrit 37.5 - 51.0 % 32.8(L) 34.1(L) 31.9(L)  Platelets 140 - 400 10e3/uL 134(L) - 197   . CBC    Component Value Date/Time   WBC 4.4 04/05/2016 1519   WBC 11.6 (H) 03/23/2015 2136   RBC 3.31 (L) 04/05/2016 1519   RBC 3.20 (L) 03/23/2015 2136   HGB 10.6 (L) 04/05/2016 1519   HCT 32.8 (L) 04/05/2016 1519   HCT 34.1 (L) 04/05/2016 1519   PLT 134 (L) 04/05/2016 1519  MCV 99.1 (H) 04/05/2016 1519   MCH 32.0 04/05/2016 1519   MCH 32.5 03/23/2015 2136   MCHC 32.3 04/05/2016 1519   MCHC 32.6 03/23/2015 2136   RDW 16.6 (H) 04/05/2016 1519   LYMPHSABS 1.5 04/05/2016 1519   MONOABS 0.4 04/05/2016 1519   EOSABS 0.0 04/05/2016 1519   BASOSABS 0.0 04/05/2016 1519     CMP Latest Ref Rng & Units 04/05/2016 04/05/2016 03/23/2015  Glucose 70 - 140 mg/dl 94 - 116(H)  BUN 7.0 - 26.0 mg/dL 21.4 - 17  Creatinine 0.7 - 1.3 mg/dL 1.2 - 0.72  Sodium 136 - 145 mEq/L 141 - 137  Potassium 3.5 - 5.1 mEq/L 4.1 - 4.3  Chloride 101 - 111 mmol/L - - 107  CO2 22 - 29 mEq/L 25 - 23  Calcium 8.4 - 10.4 mg/dL 9.4 - 8.2(L)  Total Protein 6.0 - 8.5 g/dL 6.7 6.2 -  Total Bilirubin 0.20 - 1.20 mg/dL 0.81 - -  Alkaline Phos 40 - 150 U/L 87 - -  AST 5 - 34 U/L 26 - -  ALT 0 - 55 U/L 15 - -   Component     Latest Ref Rng & Units 04/05/2016  IgG (Immunoglobin G), Serum     700 - 1,600 mg/dL 983  IgA/Immunoglobulin A, Serum     61 - 437 mg/dL 206  IgM, Qn, Serum     15 - 143 mg/dL 91  Total Protein     6.0  - 8.5 g/dL 6.2  Albumin SerPl Elph-Mcnc     2.9 - 4.4 g/dL 3.5  Alpha 1     0.0 - 0.4 g/dL 0.3  Alpha2 Glob SerPl Elph-Mcnc     0.4 - 1.0 g/dL 0.6  B-Globulin SerPl Elph-Mcnc     0.7 - 1.3 g/dL 0.9  Gamma Glob SerPl Elph-Mcnc     0.4 - 1.8 g/dL 1.0  M Protein SerPl Elph-Mcnc     Not Observed g/dL Not Observed  Globulin, Total     2.2 - 3.9 g/dL 2.7  Albumin/Glob SerPl     0.7 - 1.7 1.3  IFE 1      Comment  Please Note (HCV):      Comment  Folate, Hemolysate     Not Estab. ng/mL 312.2  HCT     37.5 - 51.0 % 34.1 (L)  Folate, RBC     >498 ng/mL 916  Sed Rate     0 - 30 mm/hr 6  LDH     125 - 245 U/L 204  Vitamin B12     232 - 1,245 pg/mL 466  Ferritin     22 - 316 ng/ml 86    Multiple Myeloma Panel (SPEP&IFE w/QIG)      No reference range information available      Resulting Lab: [facility]      Comments: An apparent normal immunofixation pattern.      RADIOGRAPHIC STUDIES: I have personally reviewed the radiological images as listed and agreed with the findings in the report. No results found.  ASSESSMENT & PLAN:   81 year old male with severe psoriasis and psoriatic arthritis on chronic methotrexate therapy for 30 years referred for worsening pancytopenia.  #1 Normocytic normochromic anemia This certainly appears multifactorial This appears to be primarily related to the patient's chronic psoriasis causing Anemia of chronic disease. Part of the anemia is also resulted from bone marrow suppression related to his chronic methotrexate use and seems to have improved with decreased methotrexate  dose. Folic acid levels within normal limits. SPEP shows no monoclonal paraproteinemia. B12 levels adequate. LDH is normal and suggests against overt hemolysis or presence of a lymphoproliferative process. Sedimentation rate at this time is within normal limits at 6. Ferritin level is 86 Plan -No indications for bone marrow biopsy at this time -Would like to  replace iron to a ferritin of more than 100 and iron saturation of more than 30. Primary care physician to optimize oral iron replacement based on iron levels. Would recommend iron polysaccharide 150 mg by mouth twice a day until ferritin levels of clearly more than 100 in an saturation of more than 30% and that it could drop it down to once a day. -Continue folic acid replacement 1-2 mg daily -Reasonable to take a daily vitamin B complex. -If despite adjusting methotrexate dose in correcting associated nutritional deficiencies the patient still gets significantly anemic or pancytopenic would need to consider possible bone marrow examination and consideration of EPO.  #2 thrombocytopenia - this appears to likely be related to methotrexate and seems to resolved with decreased dose. Monitor at this time.  #3 leukopenia - likely related to methotrexate- resolved with dose reduction. If persistent could also be potentially related to his underlying autoimmune condition.  : Labs today RTC in 3-4 weeks with Dr Irene Limbo     All of the patients questions were answered with apparent satisfaction. The patient knows to call the clinic with any problems, questions or concerns.  I spent 45 minutes counseling the patient face to face. The total time spent in the appointment was 60 minutes and more than 50% was on counseling and direct patient cares.    Sullivan Lone MD [place] AAHIVMS [facility] Hematology/Oncology [facility]  (Office):       [phone] (Work cell):  [phone] (Fax):           [phone]  04/05/2016 2:05 PM

## 2016-04-05 NOTE — Telephone Encounter (Signed)
Gave patient avs report and appointments for February.  °

## 2016-04-06 LAB — FERRITIN: FERRITIN: 86 ng/mL (ref 22–316)

## 2016-04-06 LAB — FOLATE RBC
FOLATE, HEMOLYSATE: 312.2 ng/mL
Folate, RBC: 916 ng/mL (ref >498)
HEMATOCRIT: 34.1 % — AB (ref 37.5–51.0)

## 2016-04-06 LAB — SEDIMENTATION RATE: Sedimentation Rate-Westergren: 6 mm/hr (ref 0–30)

## 2016-04-06 LAB — VITAMIN B12: Vitamin B12: 466 pg/mL (ref 232–1245)

## 2016-04-07 LAB — MULTIPLE MYELOMA PANEL, SERUM
ALBUMIN SERPL ELPH-MCNC: 3.5 g/dL (ref 2.9–4.4)
ALBUMIN/GLOB SERPL: 1.3 (ref 0.7–1.7)
Alpha 1: 0.3 g/dL (ref 0.0–0.4)
Alpha2 Glob SerPl Elph-Mcnc: 0.6 g/dL (ref 0.4–1.0)
B-GLOBULIN SERPL ELPH-MCNC: 0.9 g/dL (ref 0.7–1.3)
GAMMA GLOB SERPL ELPH-MCNC: 1 g/dL (ref 0.4–1.8)
Globulin, Total: 2.7 g/dL (ref 2.2–3.9)
IGA/IMMUNOGLOBULIN A, SERUM: 206 mg/dL (ref 61–437)
IgG, Qn, Serum: 983 mg/dL (ref 700–1600)
IgM, Qn, Serum: 91 mg/dL (ref 15–143)
Total Protein: 6.2 g/dL (ref 6.0–8.5)

## 2016-04-25 DIAGNOSIS — M25562 Pain in left knee: Secondary | ICD-10-CM | POA: Diagnosis not present

## 2016-04-25 DIAGNOSIS — M170 Bilateral primary osteoarthritis of knee: Secondary | ICD-10-CM | POA: Diagnosis not present

## 2016-04-25 DIAGNOSIS — M25561 Pain in right knee: Secondary | ICD-10-CM | POA: Diagnosis not present

## 2016-04-27 DIAGNOSIS — M545 Low back pain: Secondary | ICD-10-CM | POA: Diagnosis not present

## 2016-04-27 DIAGNOSIS — M6281 Muscle weakness (generalized): Secondary | ICD-10-CM | POA: Diagnosis not present

## 2016-04-27 DIAGNOSIS — M25651 Stiffness of right hip, not elsewhere classified: Secondary | ICD-10-CM | POA: Diagnosis not present

## 2016-04-27 DIAGNOSIS — M25551 Pain in right hip: Secondary | ICD-10-CM | POA: Diagnosis not present

## 2016-05-02 DIAGNOSIS — M25551 Pain in right hip: Secondary | ICD-10-CM | POA: Diagnosis not present

## 2016-05-02 DIAGNOSIS — M6281 Muscle weakness (generalized): Secondary | ICD-10-CM | POA: Diagnosis not present

## 2016-05-02 DIAGNOSIS — M545 Low back pain: Secondary | ICD-10-CM | POA: Diagnosis not present

## 2016-05-02 DIAGNOSIS — M25651 Stiffness of right hip, not elsewhere classified: Secondary | ICD-10-CM | POA: Diagnosis not present

## 2016-05-03 ENCOUNTER — Ambulatory Visit (HOSPITAL_BASED_OUTPATIENT_CLINIC_OR_DEPARTMENT_OTHER): Payer: Medicare Other | Admitting: Hematology

## 2016-05-03 ENCOUNTER — Encounter: Payer: Self-pay | Admitting: Hematology

## 2016-05-03 VITALS — BP 115/65 | HR 68 | Temp 98.4°F | Resp 18 | Ht 76.0 in | Wt 192.7 lb

## 2016-05-03 DIAGNOSIS — D696 Thrombocytopenia, unspecified: Secondary | ICD-10-CM

## 2016-05-03 DIAGNOSIS — D61818 Other pancytopenia: Secondary | ICD-10-CM | POA: Diagnosis not present

## 2016-05-03 DIAGNOSIS — D509 Iron deficiency anemia, unspecified: Secondary | ICD-10-CM

## 2016-05-03 DIAGNOSIS — D539 Nutritional anemia, unspecified: Secondary | ICD-10-CM

## 2016-05-04 DIAGNOSIS — M545 Low back pain: Secondary | ICD-10-CM | POA: Diagnosis not present

## 2016-05-04 DIAGNOSIS — M6281 Muscle weakness (generalized): Secondary | ICD-10-CM | POA: Diagnosis not present

## 2016-05-04 DIAGNOSIS — M25651 Stiffness of right hip, not elsewhere classified: Secondary | ICD-10-CM | POA: Diagnosis not present

## 2016-05-04 DIAGNOSIS — M25551 Pain in right hip: Secondary | ICD-10-CM | POA: Diagnosis not present

## 2016-05-09 DIAGNOSIS — M25551 Pain in right hip: Secondary | ICD-10-CM | POA: Diagnosis not present

## 2016-05-09 DIAGNOSIS — M25651 Stiffness of right hip, not elsewhere classified: Secondary | ICD-10-CM | POA: Diagnosis not present

## 2016-05-09 DIAGNOSIS — M545 Low back pain: Secondary | ICD-10-CM | POA: Diagnosis not present

## 2016-05-09 DIAGNOSIS — M6281 Muscle weakness (generalized): Secondary | ICD-10-CM | POA: Diagnosis not present

## 2016-05-11 DIAGNOSIS — M25551 Pain in right hip: Secondary | ICD-10-CM | POA: Diagnosis not present

## 2016-05-11 DIAGNOSIS — M25651 Stiffness of right hip, not elsewhere classified: Secondary | ICD-10-CM | POA: Diagnosis not present

## 2016-05-11 DIAGNOSIS — M6281 Muscle weakness (generalized): Secondary | ICD-10-CM | POA: Diagnosis not present

## 2016-05-11 DIAGNOSIS — M545 Low back pain: Secondary | ICD-10-CM | POA: Diagnosis not present

## 2016-05-16 DIAGNOSIS — M545 Low back pain: Secondary | ICD-10-CM | POA: Diagnosis not present

## 2016-05-16 DIAGNOSIS — M25551 Pain in right hip: Secondary | ICD-10-CM | POA: Diagnosis not present

## 2016-05-16 DIAGNOSIS — M25651 Stiffness of right hip, not elsewhere classified: Secondary | ICD-10-CM | POA: Diagnosis not present

## 2016-05-16 DIAGNOSIS — M6281 Muscle weakness (generalized): Secondary | ICD-10-CM | POA: Diagnosis not present

## 2016-05-18 DIAGNOSIS — M25651 Stiffness of right hip, not elsewhere classified: Secondary | ICD-10-CM | POA: Diagnosis not present

## 2016-05-18 DIAGNOSIS — M545 Low back pain: Secondary | ICD-10-CM | POA: Diagnosis not present

## 2016-05-18 DIAGNOSIS — M6281 Muscle weakness (generalized): Secondary | ICD-10-CM | POA: Diagnosis not present

## 2016-05-18 DIAGNOSIS — M25551 Pain in right hip: Secondary | ICD-10-CM | POA: Diagnosis not present

## 2016-05-23 DIAGNOSIS — M545 Low back pain: Secondary | ICD-10-CM | POA: Diagnosis not present

## 2016-05-23 DIAGNOSIS — M25651 Stiffness of right hip, not elsewhere classified: Secondary | ICD-10-CM | POA: Diagnosis not present

## 2016-05-23 DIAGNOSIS — M25551 Pain in right hip: Secondary | ICD-10-CM | POA: Diagnosis not present

## 2016-05-23 DIAGNOSIS — M6281 Muscle weakness (generalized): Secondary | ICD-10-CM | POA: Diagnosis not present

## 2016-05-25 DIAGNOSIS — M6281 Muscle weakness (generalized): Secondary | ICD-10-CM | POA: Diagnosis not present

## 2016-05-25 DIAGNOSIS — M545 Low back pain: Secondary | ICD-10-CM | POA: Diagnosis not present

## 2016-05-25 DIAGNOSIS — M25551 Pain in right hip: Secondary | ICD-10-CM | POA: Diagnosis not present

## 2016-05-25 DIAGNOSIS — M25651 Stiffness of right hip, not elsewhere classified: Secondary | ICD-10-CM | POA: Diagnosis not present

## 2016-05-30 DIAGNOSIS — M25651 Stiffness of right hip, not elsewhere classified: Secondary | ICD-10-CM | POA: Diagnosis not present

## 2016-05-30 DIAGNOSIS — M545 Low back pain: Secondary | ICD-10-CM | POA: Diagnosis not present

## 2016-05-30 DIAGNOSIS — M25551 Pain in right hip: Secondary | ICD-10-CM | POA: Diagnosis not present

## 2016-05-30 DIAGNOSIS — M6281 Muscle weakness (generalized): Secondary | ICD-10-CM | POA: Diagnosis not present

## 2016-06-01 DIAGNOSIS — D649 Anemia, unspecified: Secondary | ICD-10-CM | POA: Diagnosis not present

## 2016-06-01 DIAGNOSIS — M25651 Stiffness of right hip, not elsewhere classified: Secondary | ICD-10-CM | POA: Diagnosis not present

## 2016-06-01 DIAGNOSIS — M6281 Muscle weakness (generalized): Secondary | ICD-10-CM | POA: Diagnosis not present

## 2016-06-01 DIAGNOSIS — M545 Low back pain: Secondary | ICD-10-CM | POA: Diagnosis not present

## 2016-06-01 DIAGNOSIS — M25551 Pain in right hip: Secondary | ICD-10-CM | POA: Diagnosis not present

## 2016-06-01 DIAGNOSIS — D696 Thrombocytopenia, unspecified: Secondary | ICD-10-CM | POA: Diagnosis not present

## 2016-06-06 DIAGNOSIS — M6281 Muscle weakness (generalized): Secondary | ICD-10-CM | POA: Diagnosis not present

## 2016-06-06 DIAGNOSIS — H401133 Primary open-angle glaucoma, bilateral, severe stage: Secondary | ICD-10-CM | POA: Diagnosis not present

## 2016-06-06 DIAGNOSIS — M545 Low back pain: Secondary | ICD-10-CM | POA: Diagnosis not present

## 2016-06-06 DIAGNOSIS — M25551 Pain in right hip: Secondary | ICD-10-CM | POA: Diagnosis not present

## 2016-06-06 DIAGNOSIS — M25651 Stiffness of right hip, not elsewhere classified: Secondary | ICD-10-CM | POA: Diagnosis not present

## 2016-06-08 DIAGNOSIS — R5383 Other fatigue: Secondary | ICD-10-CM | POA: Diagnosis not present

## 2016-06-08 DIAGNOSIS — M25551 Pain in right hip: Secondary | ICD-10-CM | POA: Diagnosis not present

## 2016-06-08 DIAGNOSIS — M545 Low back pain: Secondary | ICD-10-CM | POA: Diagnosis not present

## 2016-06-08 DIAGNOSIS — M25651 Stiffness of right hip, not elsewhere classified: Secondary | ICD-10-CM | POA: Diagnosis not present

## 2016-06-08 DIAGNOSIS — M6281 Muscle weakness (generalized): Secondary | ICD-10-CM | POA: Diagnosis not present

## 2016-06-08 DIAGNOSIS — D696 Thrombocytopenia, unspecified: Secondary | ICD-10-CM | POA: Diagnosis not present

## 2016-06-13 DIAGNOSIS — M25551 Pain in right hip: Secondary | ICD-10-CM | POA: Diagnosis not present

## 2016-06-13 DIAGNOSIS — M545 Low back pain: Secondary | ICD-10-CM | POA: Diagnosis not present

## 2016-06-13 DIAGNOSIS — M6281 Muscle weakness (generalized): Secondary | ICD-10-CM | POA: Diagnosis not present

## 2016-06-13 DIAGNOSIS — M25651 Stiffness of right hip, not elsewhere classified: Secondary | ICD-10-CM | POA: Diagnosis not present

## 2016-06-14 DIAGNOSIS — L409 Psoriasis, unspecified: Secondary | ICD-10-CM | POA: Diagnosis not present

## 2016-06-14 DIAGNOSIS — M810 Age-related osteoporosis without current pathological fracture: Secondary | ICD-10-CM | POA: Diagnosis not present

## 2016-06-14 DIAGNOSIS — Z79899 Other long term (current) drug therapy: Secondary | ICD-10-CM | POA: Diagnosis not present

## 2016-06-14 DIAGNOSIS — Z9181 History of falling: Secondary | ICD-10-CM | POA: Diagnosis not present

## 2016-06-15 DIAGNOSIS — M25551 Pain in right hip: Secondary | ICD-10-CM | POA: Diagnosis not present

## 2016-06-15 DIAGNOSIS — M545 Low back pain: Secondary | ICD-10-CM | POA: Diagnosis not present

## 2016-06-15 DIAGNOSIS — M6281 Muscle weakness (generalized): Secondary | ICD-10-CM | POA: Diagnosis not present

## 2016-06-15 DIAGNOSIS — M25651 Stiffness of right hip, not elsewhere classified: Secondary | ICD-10-CM | POA: Diagnosis not present

## 2016-06-20 DIAGNOSIS — M545 Low back pain: Secondary | ICD-10-CM | POA: Diagnosis not present

## 2016-06-20 DIAGNOSIS — M25651 Stiffness of right hip, not elsewhere classified: Secondary | ICD-10-CM | POA: Diagnosis not present

## 2016-06-20 DIAGNOSIS — M25551 Pain in right hip: Secondary | ICD-10-CM | POA: Diagnosis not present

## 2016-06-20 DIAGNOSIS — M6281 Muscle weakness (generalized): Secondary | ICD-10-CM | POA: Diagnosis not present

## 2016-06-22 DIAGNOSIS — M545 Low back pain: Secondary | ICD-10-CM | POA: Diagnosis not present

## 2016-06-22 DIAGNOSIS — M6281 Muscle weakness (generalized): Secondary | ICD-10-CM | POA: Diagnosis not present

## 2016-06-22 DIAGNOSIS — M25651 Stiffness of right hip, not elsewhere classified: Secondary | ICD-10-CM | POA: Diagnosis not present

## 2016-06-22 DIAGNOSIS — M25551 Pain in right hip: Secondary | ICD-10-CM | POA: Diagnosis not present

## 2016-06-27 DIAGNOSIS — M6281 Muscle weakness (generalized): Secondary | ICD-10-CM | POA: Diagnosis not present

## 2016-06-27 DIAGNOSIS — M25651 Stiffness of right hip, not elsewhere classified: Secondary | ICD-10-CM | POA: Diagnosis not present

## 2016-06-27 DIAGNOSIS — M545 Low back pain: Secondary | ICD-10-CM | POA: Diagnosis not present

## 2016-06-27 DIAGNOSIS — M25551 Pain in right hip: Secondary | ICD-10-CM | POA: Diagnosis not present

## 2016-07-04 DIAGNOSIS — M545 Low back pain: Secondary | ICD-10-CM | POA: Diagnosis not present

## 2016-07-04 DIAGNOSIS — M25651 Stiffness of right hip, not elsewhere classified: Secondary | ICD-10-CM | POA: Diagnosis not present

## 2016-07-04 DIAGNOSIS — M6281 Muscle weakness (generalized): Secondary | ICD-10-CM | POA: Diagnosis not present

## 2016-07-04 DIAGNOSIS — M25551 Pain in right hip: Secondary | ICD-10-CM | POA: Diagnosis not present

## 2016-07-06 DIAGNOSIS — M25551 Pain in right hip: Secondary | ICD-10-CM | POA: Diagnosis not present

## 2016-07-06 DIAGNOSIS — M25651 Stiffness of right hip, not elsewhere classified: Secondary | ICD-10-CM | POA: Diagnosis not present

## 2016-07-06 DIAGNOSIS — M545 Low back pain: Secondary | ICD-10-CM | POA: Diagnosis not present

## 2016-07-06 DIAGNOSIS — M6281 Muscle weakness (generalized): Secondary | ICD-10-CM | POA: Diagnosis not present

## 2016-07-11 DIAGNOSIS — M25651 Stiffness of right hip, not elsewhere classified: Secondary | ICD-10-CM | POA: Diagnosis not present

## 2016-07-11 DIAGNOSIS — M25551 Pain in right hip: Secondary | ICD-10-CM | POA: Diagnosis not present

## 2016-07-11 DIAGNOSIS — M6281 Muscle weakness (generalized): Secondary | ICD-10-CM | POA: Diagnosis not present

## 2016-07-11 DIAGNOSIS — M545 Low back pain: Secondary | ICD-10-CM | POA: Diagnosis not present

## 2016-07-13 DIAGNOSIS — M25651 Stiffness of right hip, not elsewhere classified: Secondary | ICD-10-CM | POA: Diagnosis not present

## 2016-07-13 DIAGNOSIS — M25551 Pain in right hip: Secondary | ICD-10-CM | POA: Diagnosis not present

## 2016-07-13 DIAGNOSIS — M6281 Muscle weakness (generalized): Secondary | ICD-10-CM | POA: Diagnosis not present

## 2016-07-13 DIAGNOSIS — M545 Low back pain: Secondary | ICD-10-CM | POA: Diagnosis not present

## 2016-07-24 NOTE — Progress Notes (Signed)
Marland Kitchen    HEMATOLOGY/ONCOLOGY CLINIC NOTE  Date of Service: .05/03/2016  PCP: Merrilee Seashore MD  CHIEF COMPLAINTS/PURPOSE OF CONSULTATION:  Anemia and thrombocytopenia and leukopenia   HISTORY OF PRESENTING ILLNESS:  Bradley Lewis is a wonderful 81 y.o. male who has been referred to [place] by Dr Merrilee Seashore MD for evaluation and management of anemia and thrombocytopenia.  Patient has a history of hypertension, significant psoriasis with psoriatic arthritis on chronic methotrexate therapy for 30 years by his dermatologist Dr. Ronnald Ramp, osteoporosis (on Forteo since April 2017), BPH, osteoarthritis, vitamin D deficiency.  Patient was seen by his primary care physician in November 2017 and had labs on 01/27/2016 that showed a hemoglobin level of 9.2 with an MCV of 99, platelet count of 91,000 and WBC count of 3.6k.  Review of outside labs suggest that his platelet, anemia and leukopenia counts have been fluctuating with time since 2016. This could be likely related to his methotrexate dosing or autoimmune physiology related to his significant psoriasis.  Patient had iron studies which showed iron saturation of 18%. Ferritin level not available. He notes he was started on iron pills by his primary care physician. Serum folic acid levels were noted to be 13.2.  Patient reports that he has been on oral iron for 3 months now and his methotrexate dose has been reduced from 12.5 down to 10 mg weekly since his labs.  Patient notes no overt bleeding. Energy levels are somewhat low with some mild fatigue. He notes that he is working on physical therapy to try to recover from his pelvic fracture so that he can get back to his golf.  No recent infections. He does not note any other new medications. Notes that he does take folic acid 737 g daily.  INTERVAL HISTORY  Patient notes no acute issues since his last clinic visit and is here for discussion of his lab results. Notes he continues to feel  better and is trying to get more and more physically active. No further changes in his methotrexate dose.   MEDICAL HISTORY:  Past Medical History:  Diagnosis Date  . Arthritis   . Bladder calculi   . BPH (benign prostatic hyperplasia)   . GERD (gastroesophageal reflux disease)   . Glaucoma   . History of urinary retention   . PONV (postoperative nausea and vomiting)   . Psoriasis     SURGICAL HISTORY: Past Surgical History:  Procedure Laterality Date  . CATARACT EXTRACTION W/ INTRAOCULAR LENS  IMPLANT, BILATERAL  2010  . COLONOSCOPY    . CYSTOSCOPY WITH LITHOLAPAXY N/A 03/09/2015   Procedure: CYSTOSCOPY WITH CO2 LITHOLAPAXY;  Surgeon: Cleon Gustin, MD;  Location: [facility];  Service: Urology;  Laterality: N/A;  . RADIAL HEAD ARTHROPLASTY Left 04/11/2014   Procedure: LEFT RADIAL HEAD REPLACEMENT ;  Surgeon: Charlotte Crumb, MD;  Location: [place];  Service: Orthopedics;  Laterality: Left;  . TRANSURETHRAL RESECTION OF PROSTATE  11-06-2009  . TRANSURETHRAL RESECTION OF PROSTATE N/A 03/09/2015   Procedure: TRANSURETHRAL RESECTION OF THE PROSTATE WITH GYRUS INSTRUMENTS;  Surgeon: Cleon Gustin, MD;  Location: [facility];  Service: Urology;  Laterality: N/A;   History of liver biopsy 3 with the last one done in 2006- patient reports for evaluation of abnormal liver function tests in the setting of methotrexate use. Left radial head fracture in January 2016  SOCIAL HISTORY: Social History   Social History  . Marital status: Single    Spouse  name: N/A  . Number of children: N/A  . Years of education: N/A   Occupational History  . Not on file.   Social History Main Topics  . Smoking status: Never Smoker  . Smokeless tobacco: Never Used  . Alcohol use Yes     Comment: occasional  . Drug use: No  . Sexual activity: Not on file   Other Topics Concern  . Not on file   Social History Narrative  . No narrative  on file  Patient notes he works out regularly and plays golf Divorced and currently lives alone.  FAMILY HISTORY: Father- was a smoker had lung cancer Mother-ovarian cancer  ALLERGIES:  has No Known Allergies.  MEDICATIONS:  Current Outpatient Prescriptions  Medication Sig Dispense Refill  . Calcium Citrate-Vitamin D (CALCIUM + D PO) Take 1 capsule by mouth 2 (two) times daily.    . Cholecalciferol (VITAMIN D3) 5000 UNITS CAPS Take 5 capsules by mouth once a week.    . COMBIGAN 0.2-0.5 % ophthalmic solution Place 1 drop into both eyes 2 (two) times daily.  11  . methotrexate (RHEUMATREX) 2.5 MG tablet Take 15 mg by mouth once a week. Caution:Chemotherapy. Protect from light.take 6 tablets On Thursdays.    Marland Kitchen omeprazole (PRILOSEC) 20 MG capsule Take 20 mg by mouth every morning.     Marland Kitchen oxyCODONE-acetaminophen (ROXICET) 5-325 MG tablet Take 1 tablet by mouth every 4 (four) hours as needed for severe pain. 30 tablet 0  . Teriparatide, Recombinant, 600 MCG/2.4ML SOLN Inject into the skin.    Marland Kitchen timolol (BETIMOL) 0.25 % ophthalmic solution Place 1-2 drops into both eyes 2 (two) times daily.    . travoprost, benzalkonium, (TRAVATAN) 0.004 % ophthalmic solution Place 1 drop into both eyes at bedtime.     No current facility-administered medications for this visit.     REVIEW OF SYSTEMS:    10 Point review of Systems was done is negative except as noted above.  PHYSICAL EXAMINATION: ECOG PERFORMANCE STATUS: 1 - Symptomatic but completely ambulatory  . Vitals:   05/03/16 1309  BP: 115/65  Pulse: 68  Resp: 18  Temp: 98.4 F (36.9 C)   Filed Weights   05/03/16 1309  Weight: 192 lb 11.2 oz (87.4 kg)   .Body mass index is 23.46 kg/m.  GENERAL:alert, in no acute distress and comfortable SKIN: psoriasis + EYES: normal, conjunctiva are pink and non-injected, sclera clear OROPHARYNX:no exudate, no erythema and lips, buccal mucosa, and tongue normal  NECK: supple, no JVD, thyroid  normal size, non-tender, without nodularity LYMPH:  no palpable lymphadenopathy in the cervical, axillary or inguinal LUNGS: clear to auscultation with normal respiratory effort HEART: regular rate & rhythm,  no murmurs and no lower extremity edema ABDOMEN: abdomen soft, non-tender, normoactive bowel sounds  Musculoskeletal: no cyanosis of digits and no clubbing  PSYCH: alert & oriented x 3 with fluent speech NEURO: no focal motor/sensory deficits  LABORATORY DATA:  I have reviewed the data as listed  . CBC Latest Ref Rng & Units 04/05/2016 04/05/2016 03/23/2015  WBC 4.0 - 10.3 10e3/uL 4.4 - 11.6(H)  Hemoglobin 13.0 - 17.1 g/dL 10.6(L) - 10.4(L)  Hematocrit 37.5 - 51.0 % 32.8(L) 34.1(L) 31.9(L)  Platelets 140 - 400 10e3/uL 134(L) - 197   . CBC    Component Value Date/Time   WBC 4.4 04/05/2016 1519   WBC 11.6 (H) 03/23/2015 2136   RBC 3.31 (L) 04/05/2016 1519   RBC 3.20 (L) 03/23/2015 2136   HGB  10.6 (L) 04/05/2016 1519   HCT 32.8 (L) 04/05/2016 1519   HCT 34.1 (L) 04/05/2016 1519   PLT 134 (L) 04/05/2016 1519   MCV 99.1 (H) 04/05/2016 1519   MCH 32.0 04/05/2016 1519   MCH 32.5 03/23/2015 2136   MCHC 32.3 04/05/2016 1519   MCHC 32.6 03/23/2015 2136   RDW 16.6 (H) 04/05/2016 1519   LYMPHSABS 1.5 04/05/2016 1519   MONOABS 0.4 04/05/2016 1519   EOSABS 0.0 04/05/2016 1519   BASOSABS 0.0 04/05/2016 1519     CMP Latest Ref Rng & Units 04/05/2016 04/05/2016 03/23/2015  Glucose 70 - 140 mg/dl 94 - 116(H)  BUN 7.0 - 26.0 mg/dL 21.4 - 17  Creatinine 0.7 - 1.3 mg/dL 1.2 - 0.72  Sodium 136 - 145 mEq/L 141 - 137  Potassium 3.5 - 5.1 mEq/L 4.1 - 4.3  Chloride 101 - 111 mmol/L - - 107  CO2 22 - 29 mEq/L 25 - 23  Calcium 8.4 - 10.4 mg/dL 9.4 - 8.2(L)  Total Protein 6.0 - 8.5 g/dL 6.7 6.2 -  Total Bilirubin 0.20 - 1.20 mg/dL 0.81 - -  Alkaline Phos 40 - 150 U/L 87 - -  AST 5 - 34 U/L 26 - -  ALT 0 - 55 U/L 15 - -   Component     Latest Ref Rng & Units 04/05/2016  IgG (Immunoglobin  G), Serum     700 - 1,600 mg/dL 983  IgA/Immunoglobulin A, Serum     61 - 437 mg/dL 206  IgM, Qn, Serum     15 - 143 mg/dL 91  Total Protein     6.0 - 8.5 g/dL 6.2  Albumin SerPl Elph-Mcnc     2.9 - 4.4 g/dL 3.5  Alpha 1     0.0 - 0.4 g/dL 0.3  Alpha2 Glob SerPl Elph-Mcnc     0.4 - 1.0 g/dL 0.6  B-Globulin SerPl Elph-Mcnc     0.7 - 1.3 g/dL 0.9  Gamma Glob SerPl Elph-Mcnc     0.4 - 1.8 g/dL 1.0  M Protein SerPl Elph-Mcnc     Not Observed g/dL Not Observed  Globulin, Total     2.2 - 3.9 g/dL 2.7  Albumin/Glob SerPl     0.7 - 1.7 1.3  IFE 1      Comment  Please Note (HCV):      Comment  Folate, Hemolysate     Not Estab. ng/mL 312.2  HCT     37.5 - 51.0 % 34.1 (L)  Folate, RBC     >498 ng/mL 916  Sed Rate     0 - 30 mm/hr 6  LDH     125 - 245 U/L 204  Vitamin B12     232 - 1,245 pg/mL 466  Ferritin     22 - 316 ng/ml 86    Multiple Myeloma Panel (SPEP&IFE w/QIG)      No reference range information available      Resulting Lab: [facility]      Comments: An apparent normal immunofixation pattern.      RADIOGRAPHIC STUDIES: I have personally reviewed the radiological images as listed and agreed with the findings in the report. No results found.  ASSESSMENT & PLAN:   81 year old male with severe psoriasis and psoriatic arthritis on chronic methotrexate therapy for 30 years referred for worsening pancytopenia.  #1 Normocytic normochromic anemia This certainly appears multifactorial This appears to be primarily related to the patient's chronic psoriasis causing Anemia of  chronic disease. Part of the anemia is also resulted from bone marrow suppression related to his chronic methotrexate use and seems to have improved with decreased methotrexate dose. Folic acid levels within normal limits. SPEP shows no monoclonal paraproteinemia. B12 levels adequate. LDH is normal and suggests against overt hemolysis or presence of a lymphoproliferative  process. Sedimentation rate at this time is within normal limits at 6. Ferritin level is 86 Plan -Lab results were discussed in detail with the patient. -We discussed all the below recommendations. He prefers to continue follow-up with his primary care physician for ongoing lab monitoring which would be appropriate. -No indications for bone marrow biopsy at this time -Would like to replace iron to a ferritin of more than 100 and iron saturation of more than 30. Primary care physician to optimize oral iron replacement based on iron levels. Would recommend iron polysaccharide 150 mg by mouth twice a day until ferritin levels of clearly more than 100 in an saturation of more than 30% and that it could drop it down to once a day. -Continue folic acid replacement 1-2 mg daily -Reasonable to take a daily vitamin B complex. -If despite adjusting methotrexate dose in correcting associated nutritional deficiencies the patient still gets significantly anemic or pancytopenic would need to consider possible bone marrow examination and consideration of EPO.  #2 thrombocytopenia - this appears to likely be related to methotrexate and seems to resolved with decreased dose. Monitor at this time.  #3 leukopenia - likely related to methotrexate- resolved with dose reduction. If persistent could also be potentially related to his underlying autoimmune condition.   Continue follow-up with primary care physician  RTC with Dr. Irene Limbo on as-needed basis.   All of the patients questions were answered with apparent satisfaction. The patient knows to call the clinic with any problems, questions or concerns.  I spent 20 minutes counseling the patient face to face. The total time spent in the appointment was 20 minutes and more than 50% was on counseling and direct patient cares.    Sullivan Lone MD [place] AAHIVMS [facility] Hematology/Oncology Physician [facility]  (Office):       [phone] (Work cell):   [phone] (Fax):           [phone]  07/24/2016 4:40 PM

## 2016-07-25 DIAGNOSIS — Z789 Other specified health status: Secondary | ICD-10-CM | POA: Diagnosis not present

## 2016-07-25 DIAGNOSIS — M170 Bilateral primary osteoarthritis of knee: Secondary | ICD-10-CM | POA: Diagnosis not present

## 2016-07-25 DIAGNOSIS — M1712 Unilateral primary osteoarthritis, left knee: Secondary | ICD-10-CM | POA: Diagnosis not present

## 2016-07-25 DIAGNOSIS — M25562 Pain in left knee: Secondary | ICD-10-CM | POA: Diagnosis not present

## 2016-07-25 DIAGNOSIS — M25561 Pain in right knee: Secondary | ICD-10-CM | POA: Diagnosis not present

## 2016-08-02 DIAGNOSIS — L400 Psoriasis vulgaris: Secondary | ICD-10-CM | POA: Diagnosis not present

## 2016-08-02 DIAGNOSIS — L570 Actinic keratosis: Secondary | ICD-10-CM | POA: Diagnosis not present

## 2016-08-02 DIAGNOSIS — D485 Neoplasm of uncertain behavior of skin: Secondary | ICD-10-CM | POA: Diagnosis not present

## 2016-08-02 DIAGNOSIS — Z85828 Personal history of other malignant neoplasm of skin: Secondary | ICD-10-CM | POA: Diagnosis not present

## 2016-11-11 DIAGNOSIS — H401133 Primary open-angle glaucoma, bilateral, severe stage: Secondary | ICD-10-CM | POA: Diagnosis not present

## 2016-12-14 DIAGNOSIS — H401133 Primary open-angle glaucoma, bilateral, severe stage: Secondary | ICD-10-CM | POA: Diagnosis not present

## 2016-12-26 DIAGNOSIS — C44311 Basal cell carcinoma of skin of nose: Secondary | ICD-10-CM | POA: Diagnosis not present

## 2016-12-26 DIAGNOSIS — Z79899 Other long term (current) drug therapy: Secondary | ICD-10-CM | POA: Diagnosis not present

## 2016-12-26 DIAGNOSIS — L570 Actinic keratosis: Secondary | ICD-10-CM | POA: Diagnosis not present

## 2016-12-26 DIAGNOSIS — L400 Psoriasis vulgaris: Secondary | ICD-10-CM | POA: Diagnosis not present

## 2016-12-26 DIAGNOSIS — C44319 Basal cell carcinoma of skin of other parts of face: Secondary | ICD-10-CM | POA: Diagnosis not present

## 2016-12-26 DIAGNOSIS — Z85828 Personal history of other malignant neoplasm of skin: Secondary | ICD-10-CM | POA: Diagnosis not present

## 2016-12-26 DIAGNOSIS — D485 Neoplasm of uncertain behavior of skin: Secondary | ICD-10-CM | POA: Diagnosis not present

## 2017-02-02 IMAGING — CT CT PELVIS W/O CM
2 of 3 series · 15 of 46 positions shown, 17 images · non-contrast
Comparison: Radiographs earlier this day. Right hip and pelvis MRI
10/03/2014

CLINICAL DATA: Right hip and iliac wing pain after fall. Fractures
on radiograph.

EXAM:
CT PELVIS WITHOUT CONTRAST
TECHNIQUE: Multidetector CT imaging of the pelvis was performed following the
standard protocol without intravenous contrast.

[Series 3: pelvis st · axial · 0.76mm/px · z∈[+806,+1076]mm · 12 of 62 slices shown, 14 images]
[im 4/62  soft-tissue]
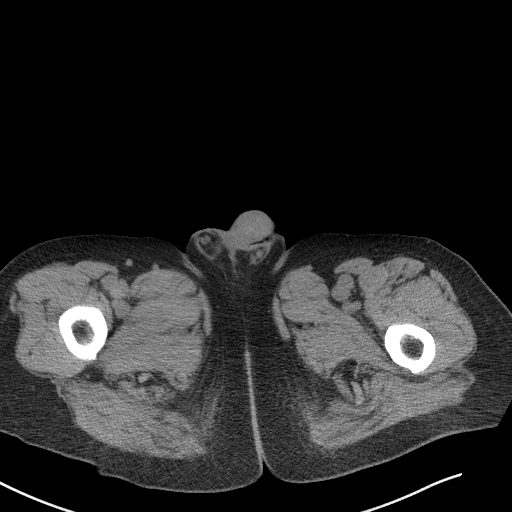
[im 4/62  bone]
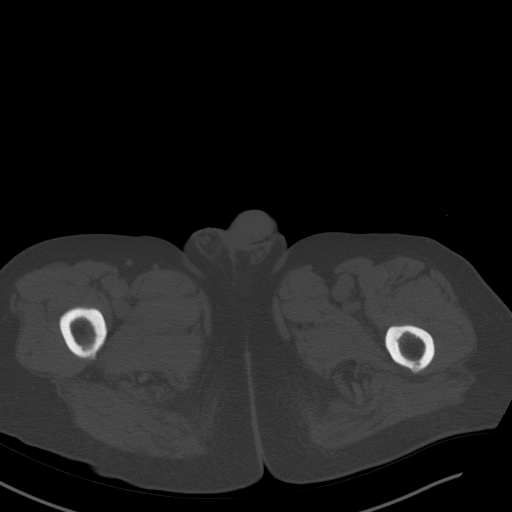
[im 8/62  soft-tissue]
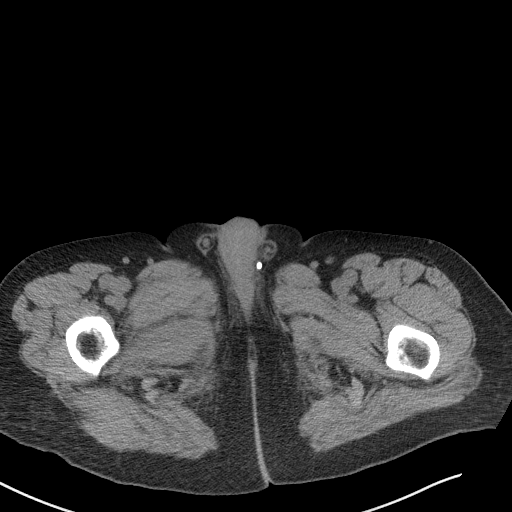
[im 14/62  soft-tissue]
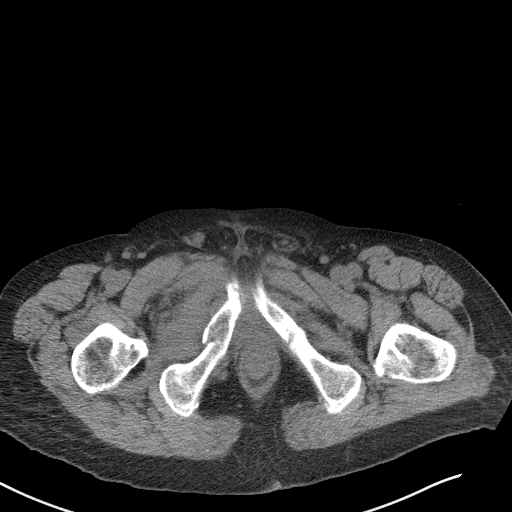
[im 18/62  soft-tissue]
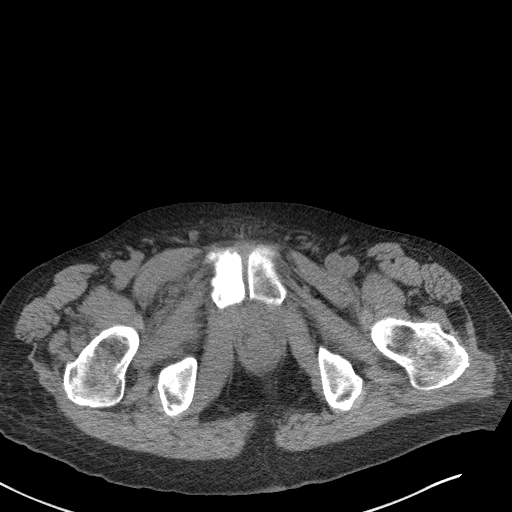
[im 24/62  soft-tissue]
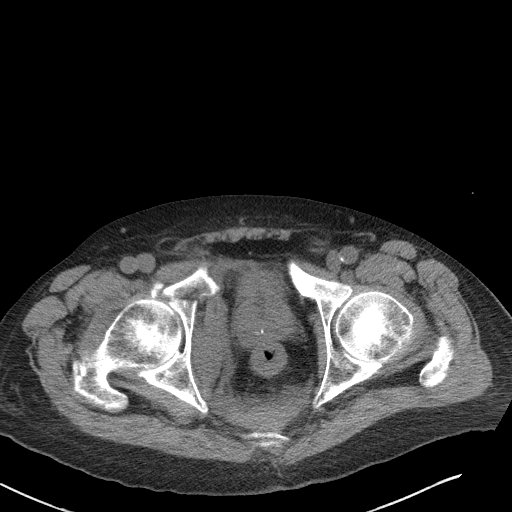
[im 28/62  soft-tissue]
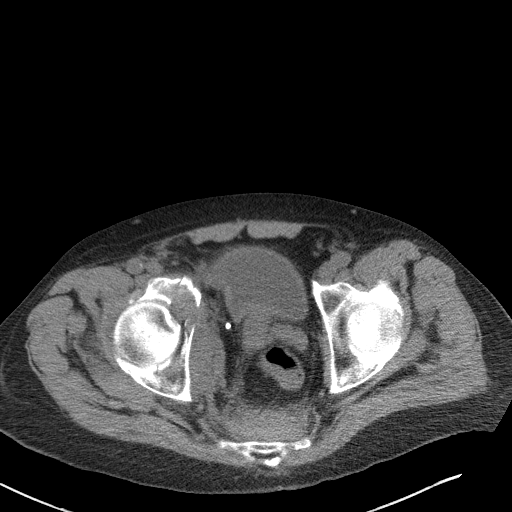
[im 34/62  soft-tissue]
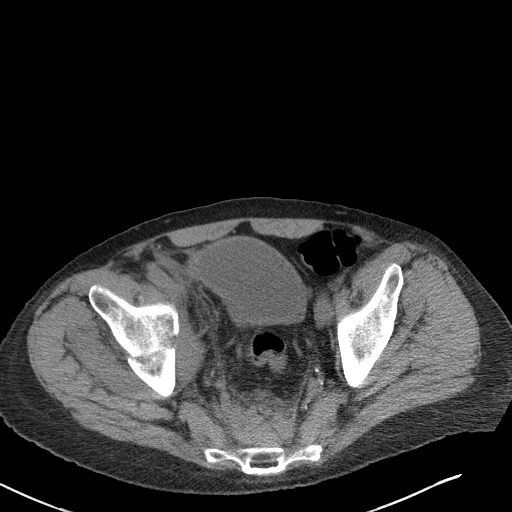
[im 38/62  soft-tissue]
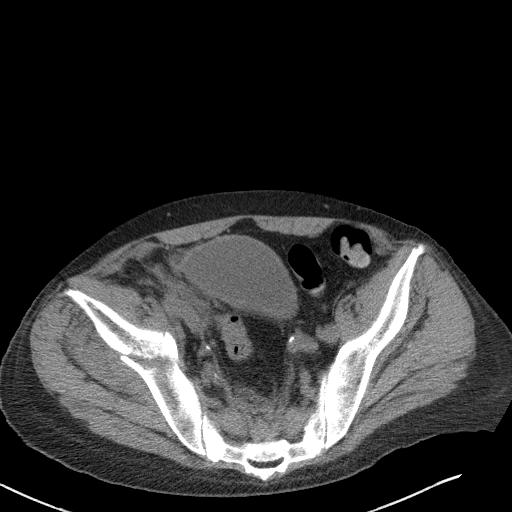
[im 44/62  soft-tissue]
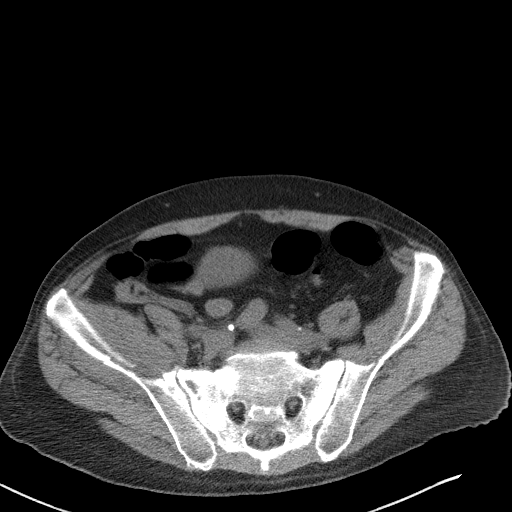
[im 44/62  bone]
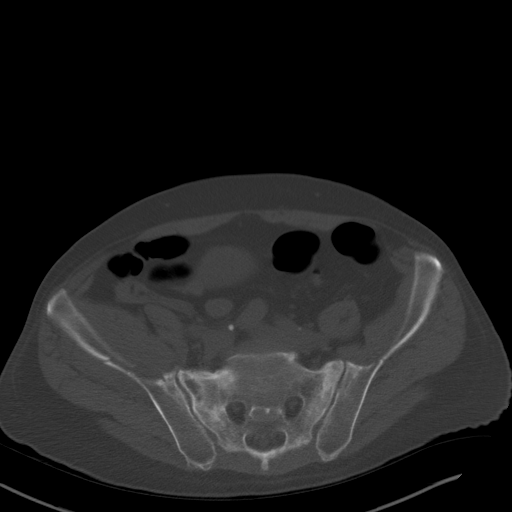
[im 48/62  soft-tissue]
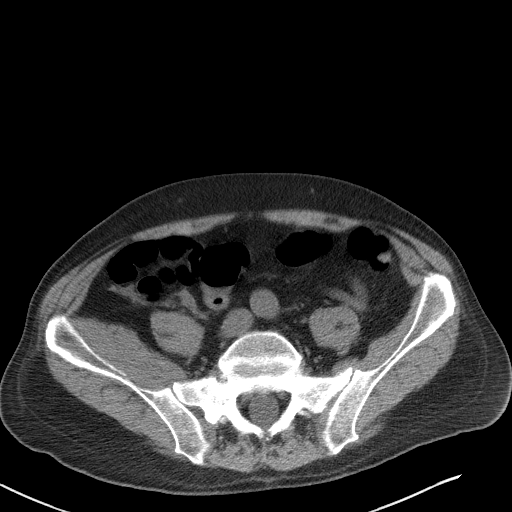
[im 54/62  soft-tissue]
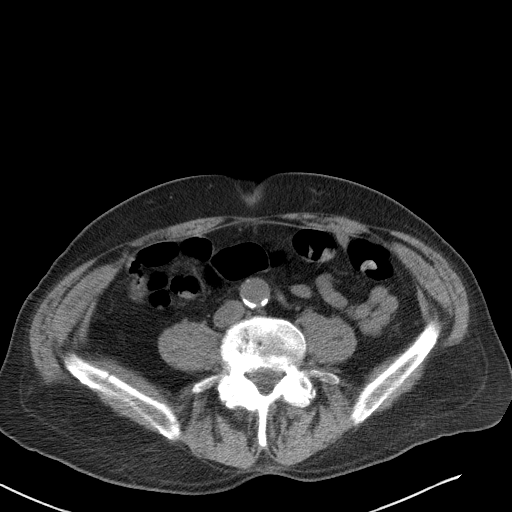
[im 58/62  soft-tissue]
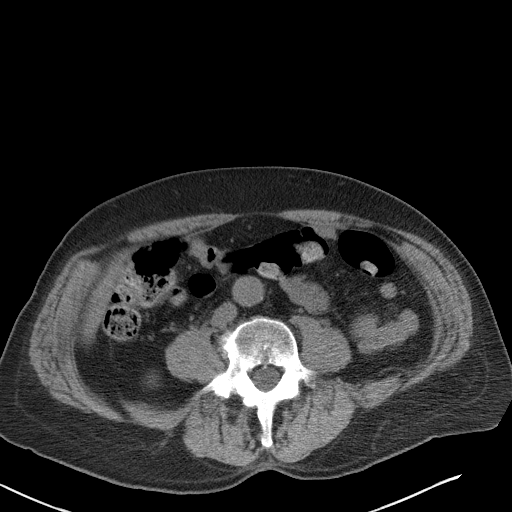

[Series 4: coronal images · coronal · 0.60mm/px · 3 of 166 slices shown]
[im 56/166  soft-tissue]
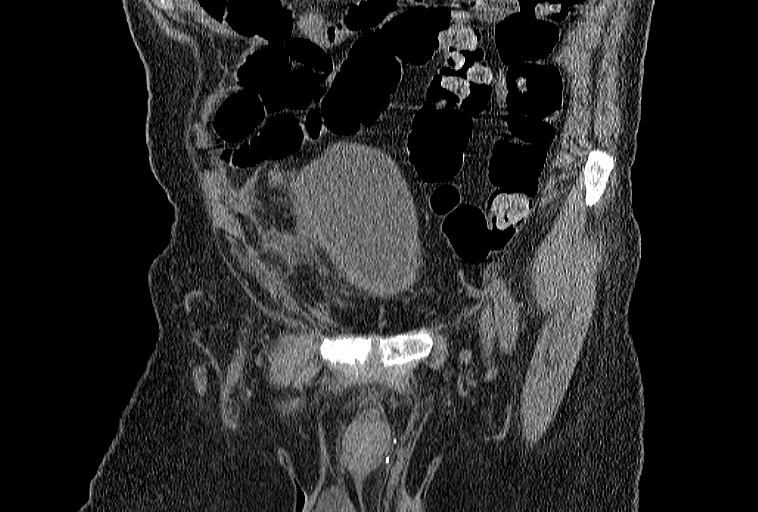
[im 74/166  soft-tissue]
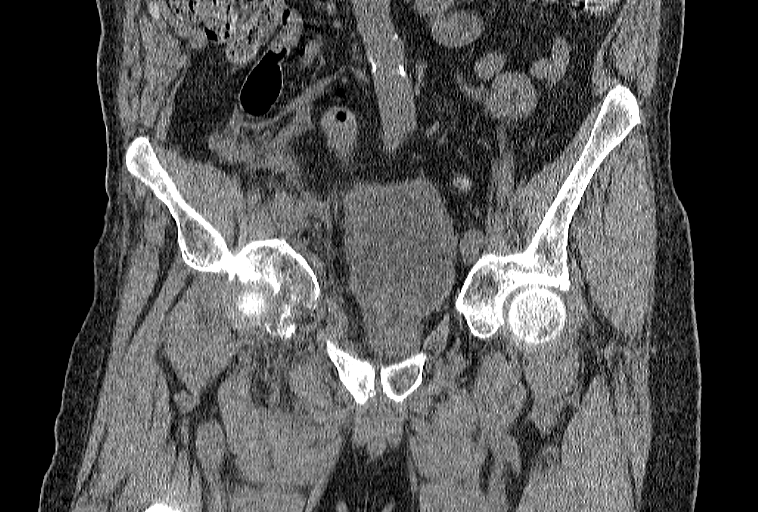
[im 92/166  soft-tissue]
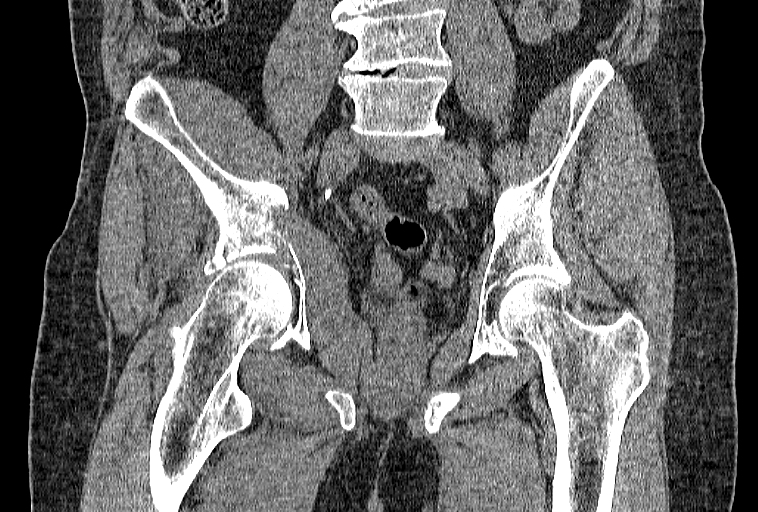

[15 of 46 positions shown; findings below may reference images not displayed]

FINDINGS: Comminuted right-sided pelvic fracture. There is a comminuted both
column fracture involving the right acetabulum with multifocal
articular surface involvement and displacement involving the lateral
and central articular surface. Fracture extends superiorly to
involve the iliac wing with transverse component extending to the
right sacroiliac joint. Comminuted fracture involvement of the
proximal superior pubic ramus. Right inferior pubic ramus fracture
in the midportion appears acute. There are remote fractures of the
left superior and inferior pubic ramus, right pubic body, and both
sacral ala with scoliosis as seen on prior MRI. Question of acute
transverse right sacral fracture at the level of the S1-S2 foramen.
Diffuse bony under mineralization. Associated edema and hemorrhage
about the right-sided pelvic fractures within the adjacent
musculature. Small amount of extraperitoneal hemorrhage tracks about
the iliac vasculature on the right. Presacral soft tissue
prominence, of uncertain significance.
IMPRESSION: 1. Complex right-sided pelvic fracture. Comminuted both column
acetabular fracture with intra-articular displacement and displaced
extension superiorly through the iliac wing. Transverse iliac bone
involvement extends to the sacroiliac joint. Comminuted displaced
involvement of the right superior pubic ramus. Acute right inferior
pubic ramus fracture.
2. Remote fractures of the sacrum and pubic rami.
3. Soft tissue edema and hemorrhage about the right-sided pelvic
fractures. Presacral soft tissue density is of uncertain
significance and may reflect sequela of hemorrhage. In the absence
of malignancy history, neoplasm or metastasis is felt unlikely.

## 2017-02-02 IMAGING — CR DG HIP (WITH OR WITHOUT PELVIS) 2-3V*R*
4 series · 4 of 4 positions shown · non-contrast
Comparison: None.

CLINICAL DATA: Initial encounter for Right posterior hip pain since
3266 today. Pt states he was taking the [REDACTED] tree outside and
tripped on the steps at his home and landed on his right hip. Hx
arthritis. Pt states previous stress fx of left pelvis. Was not sure
of exact location

EXAM:
DG HIP (WITH OR WITHOUT PELVIS) 2-3V RIGHT

[x pelvis (1 of 4)]
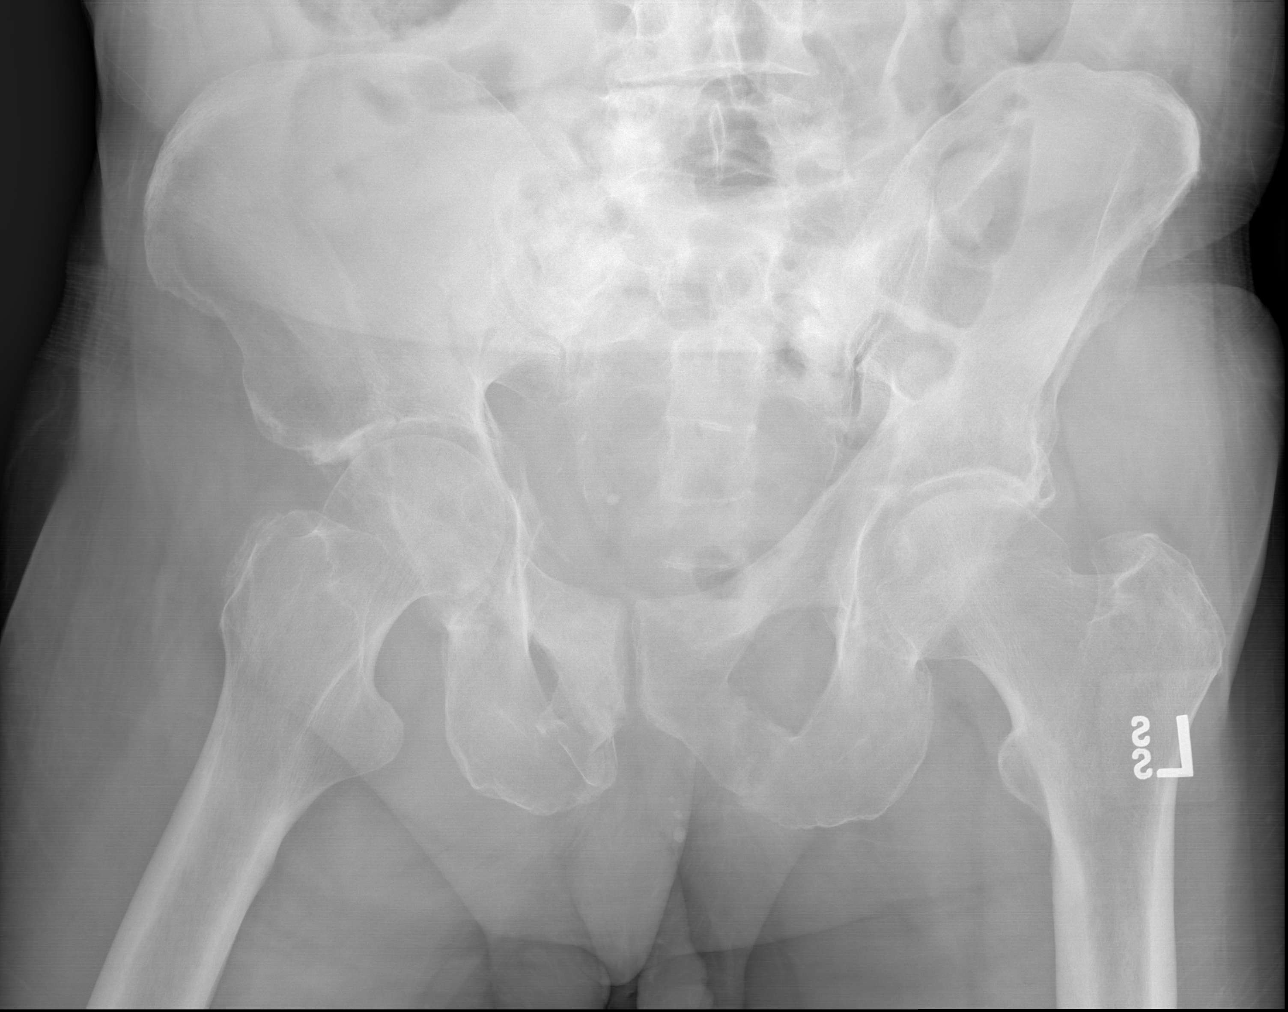

[x pelvis (2 of 4)]
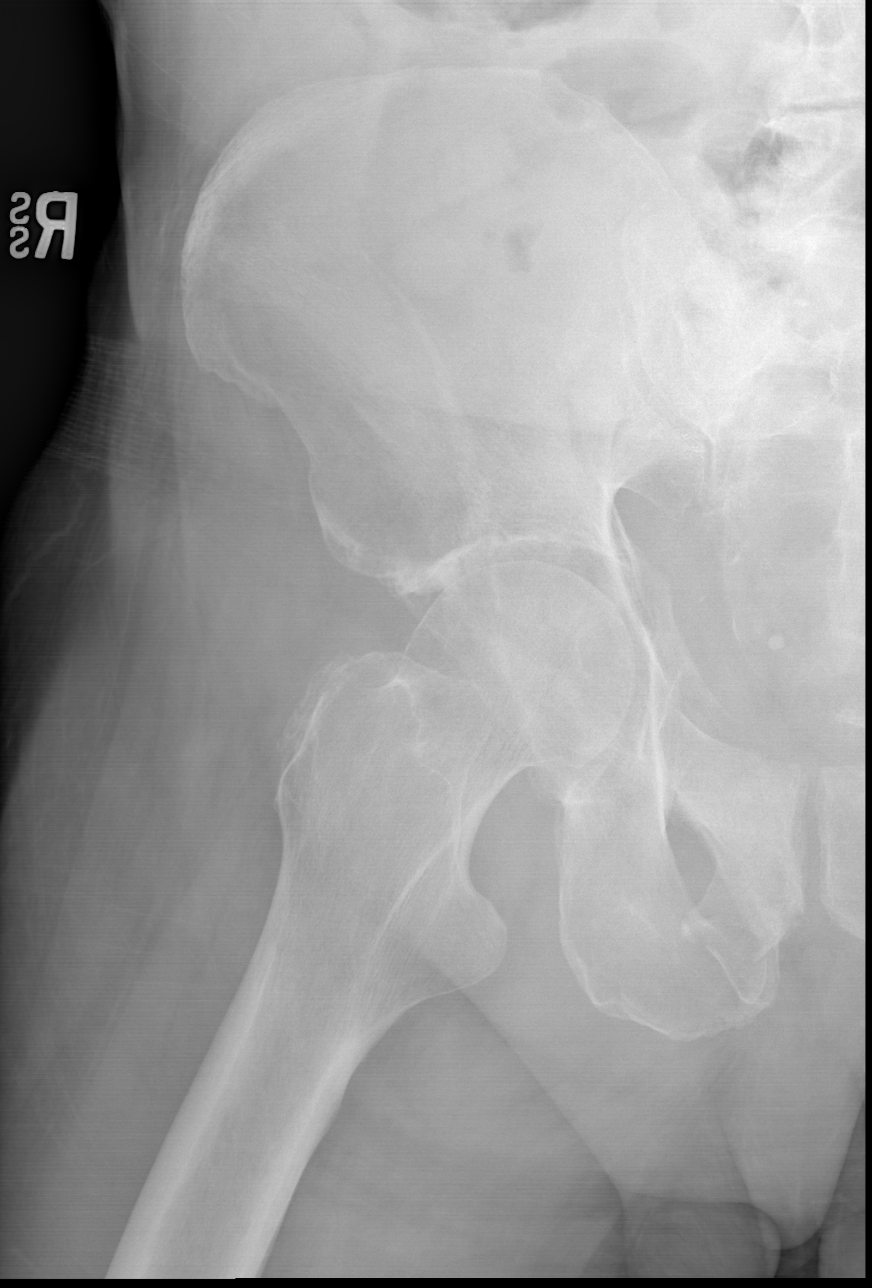

[x pelvis (3 of 4)]
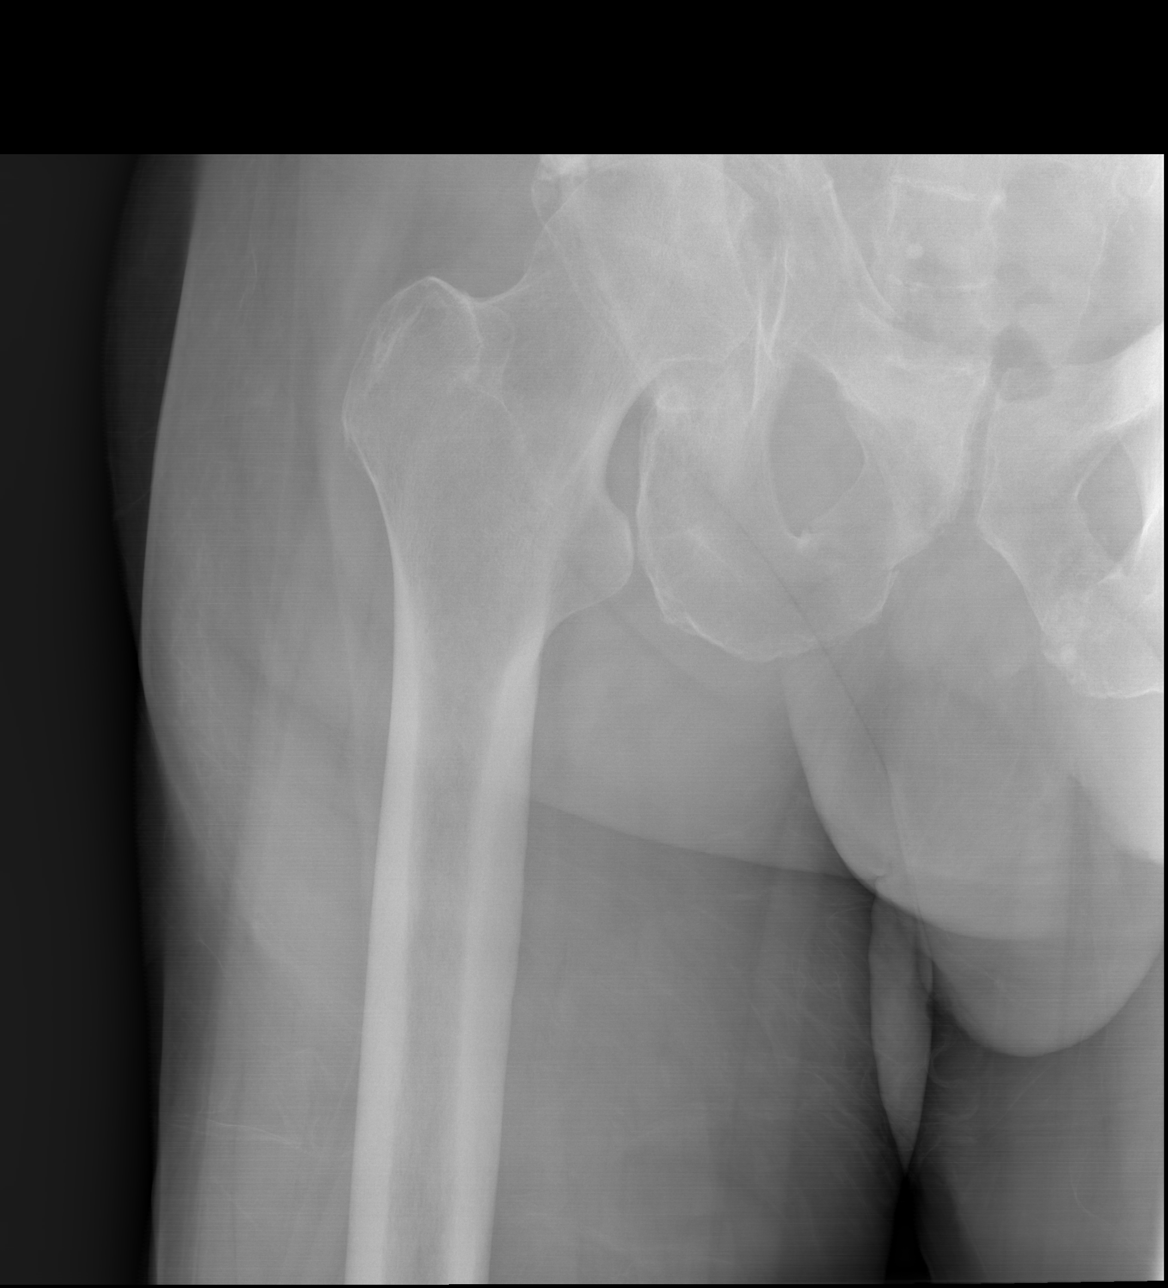

[x pelvis (4 of 4)]
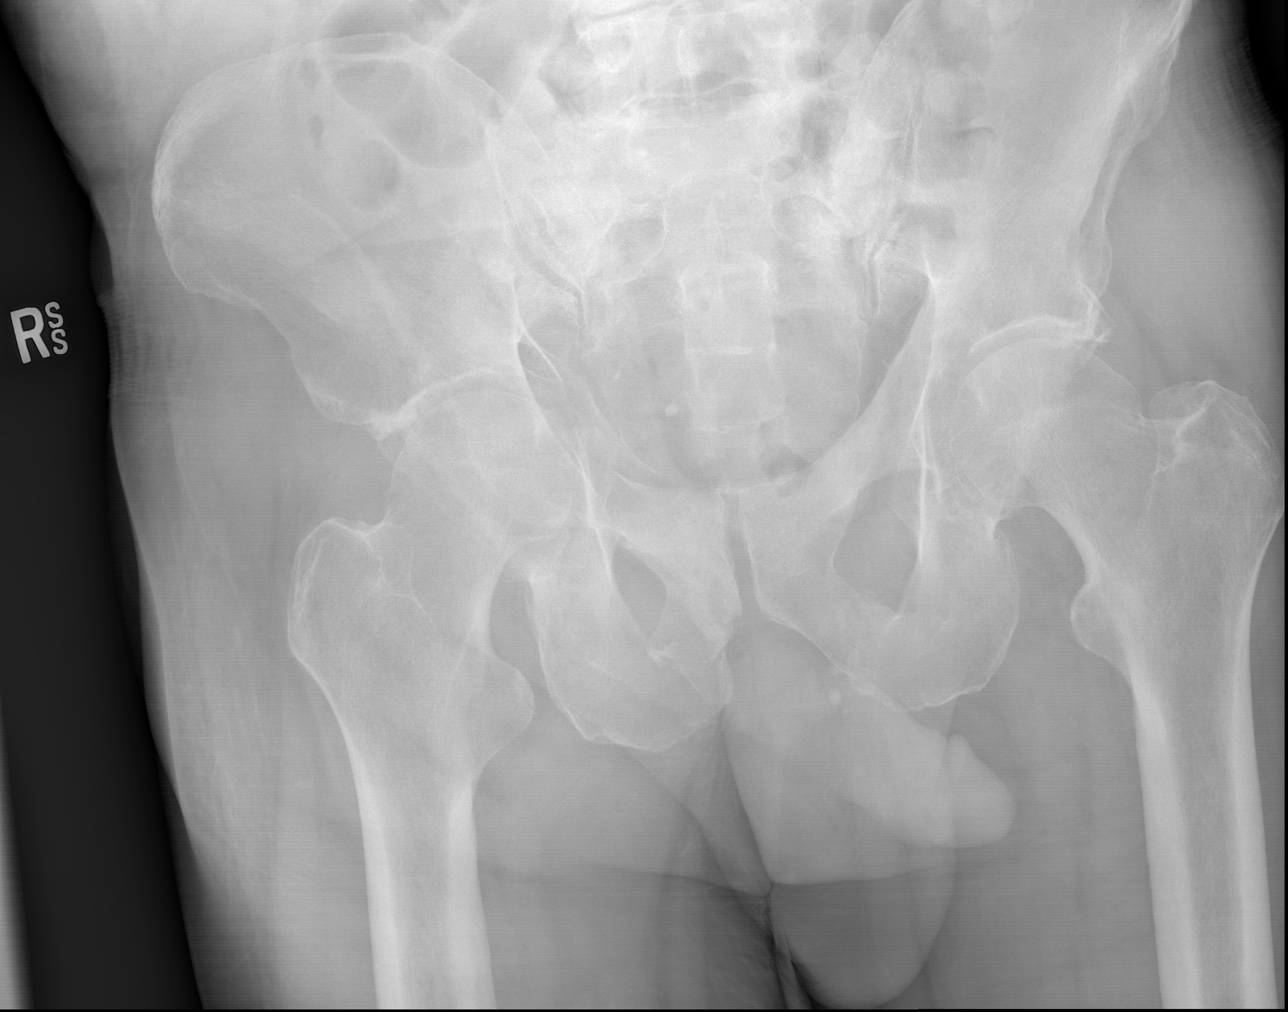

[4 of 4 positions shown; findings below may reference images not displayed]

FINDINGS: AP views the pelvis and right hip. Artifact degradation, primarily
superiorly. Femoral heads are located. No proximal femur fracture.
There is a lucency through the right acetabulum and suggestion of a
disruption of the acetabular roof on the second image. Fractures of
the right inferior and superior pubic rami are favored to be acute.
There is sclerosis in the parasymphyseal region of the right pubic
bone, suggesting chronic injury as well.
IMPRESSION: Artifact degraded images.

Probable fractures of the right acetabulum and pubic rami. Given
limitations of the current exam, the clinical history of prior
fracture, CT may be informative for further characterization.

## 2017-02-13 DIAGNOSIS — Z85828 Personal history of other malignant neoplasm of skin: Secondary | ICD-10-CM | POA: Diagnosis not present

## 2017-02-13 DIAGNOSIS — C44311 Basal cell carcinoma of skin of nose: Secondary | ICD-10-CM | POA: Diagnosis not present

## 2017-05-29 DIAGNOSIS — H401133 Primary open-angle glaucoma, bilateral, severe stage: Secondary | ICD-10-CM | POA: Diagnosis not present

## 2017-06-08 DIAGNOSIS — N401 Enlarged prostate with lower urinary tract symptoms: Secondary | ICD-10-CM | POA: Diagnosis not present

## 2017-06-08 DIAGNOSIS — R351 Nocturia: Secondary | ICD-10-CM | POA: Diagnosis not present

## 2017-06-21 DIAGNOSIS — D485 Neoplasm of uncertain behavior of skin: Secondary | ICD-10-CM | POA: Diagnosis not present

## 2017-06-21 DIAGNOSIS — C4441 Basal cell carcinoma of skin of scalp and neck: Secondary | ICD-10-CM | POA: Diagnosis not present

## 2017-06-21 DIAGNOSIS — L821 Other seborrheic keratosis: Secondary | ICD-10-CM | POA: Diagnosis not present

## 2017-06-21 DIAGNOSIS — L570 Actinic keratosis: Secondary | ICD-10-CM | POA: Diagnosis not present

## 2017-06-21 DIAGNOSIS — Z85828 Personal history of other malignant neoplasm of skin: Secondary | ICD-10-CM | POA: Diagnosis not present

## 2017-06-21 DIAGNOSIS — L400 Psoriasis vulgaris: Secondary | ICD-10-CM | POA: Diagnosis not present

## 2017-06-21 DIAGNOSIS — Z79899 Other long term (current) drug therapy: Secondary | ICD-10-CM | POA: Diagnosis not present

## 2017-06-27 DIAGNOSIS — Z79899 Other long term (current) drug therapy: Secondary | ICD-10-CM | POA: Diagnosis not present

## 2017-06-27 DIAGNOSIS — Z9181 History of falling: Secondary | ICD-10-CM | POA: Diagnosis not present

## 2017-06-27 DIAGNOSIS — M810 Age-related osteoporosis without current pathological fracture: Secondary | ICD-10-CM | POA: Diagnosis not present

## 2017-06-27 DIAGNOSIS — R269 Unspecified abnormalities of gait and mobility: Secondary | ICD-10-CM | POA: Diagnosis not present

## 2017-06-27 DIAGNOSIS — L409 Psoriasis, unspecified: Secondary | ICD-10-CM | POA: Diagnosis not present

## 2017-07-27 DIAGNOSIS — M545 Low back pain: Secondary | ICD-10-CM | POA: Diagnosis not present

## 2017-07-27 DIAGNOSIS — M1611 Unilateral primary osteoarthritis, right hip: Secondary | ICD-10-CM | POA: Diagnosis not present

## 2017-07-27 DIAGNOSIS — M5441 Lumbago with sciatica, right side: Secondary | ICD-10-CM | POA: Diagnosis not present

## 2017-08-17 DIAGNOSIS — M5441 Lumbago with sciatica, right side: Secondary | ICD-10-CM | POA: Diagnosis not present

## 2017-08-17 DIAGNOSIS — M545 Low back pain: Secondary | ICD-10-CM | POA: Diagnosis not present

## 2017-09-21 DIAGNOSIS — M79605 Pain in left leg: Secondary | ICD-10-CM | POA: Diagnosis not present

## 2017-10-02 DIAGNOSIS — H401133 Primary open-angle glaucoma, bilateral, severe stage: Secondary | ICD-10-CM | POA: Diagnosis not present

## 2017-11-22 ENCOUNTER — Other Ambulatory Visit: Payer: Self-pay | Admitting: Podiatry

## 2017-11-22 ENCOUNTER — Encounter: Payer: Self-pay | Admitting: Podiatry

## 2017-11-22 ENCOUNTER — Ambulatory Visit (INDEPENDENT_AMBULATORY_CARE_PROVIDER_SITE_OTHER): Payer: Medicare Other | Admitting: Podiatry

## 2017-11-22 ENCOUNTER — Ambulatory Visit (INDEPENDENT_AMBULATORY_CARE_PROVIDER_SITE_OTHER): Payer: Medicare Other

## 2017-11-22 DIAGNOSIS — M2041 Other hammer toe(s) (acquired), right foot: Secondary | ICD-10-CM

## 2017-11-22 DIAGNOSIS — L84 Corns and callosities: Secondary | ICD-10-CM | POA: Diagnosis not present

## 2017-11-22 DIAGNOSIS — M79674 Pain in right toe(s): Secondary | ICD-10-CM | POA: Diagnosis not present

## 2017-11-23 NOTE — Progress Notes (Signed)
Subjective:   Patient ID: Bradley Lewis, male   DOB: 82 y.o.   MRN: [id number]   HPI Patient presents with digital deformity second toe right foot that is been increasingly hard for him to walk with.  He is developed a lesion at the end of the toe and he does like golf and its very tender when pressed.  Patient has not tried any conservative care except for wider shoes and soaks   ROS      Objective:  Physical Exam  Neurovascular status was found to be mildly diminished but intact with good digital perfusion.  Patient is noted to have significant elongation digit to right with distal contracture also noted and distal keratotic lesion which is painful when pressed.  Patient upon gait evaluation does have a plantar flexion of the digit     Assessment:  Hammertoe deformity digit to right with distal keratotic lesion and elongated digit     Plan:  H&P condition reviewed with patient.  I did discuss ultimately digital shortening with possible distal arthroplasty but at this point debridement of the lesion is recommended with padding and we will see how long this gives him relief.  Most likely in the wintertime we will have to do procedure on this but we will reevaluate depending on his symptoms  X-ray indicates that there is elongation second digit right and there is significant plantar flexion deformity of the lesser digits

## 2017-11-30 DIAGNOSIS — M2042 Other hammer toe(s) (acquired), left foot: Secondary | ICD-10-CM | POA: Diagnosis not present

## 2017-11-30 DIAGNOSIS — M79674 Pain in right toe(s): Secondary | ICD-10-CM | POA: Diagnosis not present

## 2017-11-30 DIAGNOSIS — M2041 Other hammer toe(s) (acquired), right foot: Secondary | ICD-10-CM | POA: Diagnosis not present

## 2017-12-27 DIAGNOSIS — H61002 Unspecified perichondritis of left external ear: Secondary | ICD-10-CM | POA: Diagnosis not present

## 2017-12-27 DIAGNOSIS — Z85828 Personal history of other malignant neoplasm of skin: Secondary | ICD-10-CM | POA: Diagnosis not present

## 2017-12-27 DIAGNOSIS — L570 Actinic keratosis: Secondary | ICD-10-CM | POA: Diagnosis not present

## 2017-12-27 DIAGNOSIS — L400 Psoriasis vulgaris: Secondary | ICD-10-CM | POA: Diagnosis not present

## 2017-12-27 DIAGNOSIS — L821 Other seborrheic keratosis: Secondary | ICD-10-CM | POA: Diagnosis not present

## 2017-12-27 DIAGNOSIS — Z79899 Other long term (current) drug therapy: Secondary | ICD-10-CM | POA: Diagnosis not present

## 2018-01-08 DIAGNOSIS — Z79899 Other long term (current) drug therapy: Secondary | ICD-10-CM | POA: Diagnosis not present

## 2018-01-08 DIAGNOSIS — Z5181 Encounter for therapeutic drug level monitoring: Secondary | ICD-10-CM | POA: Diagnosis not present

## 2018-01-08 DIAGNOSIS — M810 Age-related osteoporosis without current pathological fracture: Secondary | ICD-10-CM | POA: Diagnosis not present

## 2018-01-08 DIAGNOSIS — M79671 Pain in right foot: Secondary | ICD-10-CM | POA: Diagnosis not present

## 2018-01-08 DIAGNOSIS — M2041 Other hammer toe(s) (acquired), right foot: Secondary | ICD-10-CM | POA: Diagnosis not present

## 2018-02-13 DIAGNOSIS — Z79899 Other long term (current) drug therapy: Secondary | ICD-10-CM | POA: Diagnosis not present

## 2018-02-13 DIAGNOSIS — L409 Psoriasis, unspecified: Secondary | ICD-10-CM | POA: Diagnosis not present

## 2018-02-13 DIAGNOSIS — Z5181 Encounter for therapeutic drug level monitoring: Secondary | ICD-10-CM | POA: Diagnosis not present

## 2018-02-23 DIAGNOSIS — H401133 Primary open-angle glaucoma, bilateral, severe stage: Secondary | ICD-10-CM | POA: Diagnosis not present

## 2018-02-28 DIAGNOSIS — R5383 Other fatigue: Secondary | ICD-10-CM | POA: Diagnosis not present

## 2018-02-28 DIAGNOSIS — Z125 Encounter for screening for malignant neoplasm of prostate: Secondary | ICD-10-CM | POA: Diagnosis not present

## 2018-02-28 DIAGNOSIS — Z0000 Encounter for general adult medical examination without abnormal findings: Secondary | ICD-10-CM | POA: Diagnosis not present

## 2018-02-28 DIAGNOSIS — E559 Vitamin D deficiency, unspecified: Secondary | ICD-10-CM | POA: Diagnosis not present

## 2018-02-28 DIAGNOSIS — R0602 Shortness of breath: Secondary | ICD-10-CM | POA: Diagnosis not present

## 2018-02-28 DIAGNOSIS — R030 Elevated blood-pressure reading, without diagnosis of hypertension: Secondary | ICD-10-CM | POA: Diagnosis not present

## 2018-03-08 DIAGNOSIS — Z23 Encounter for immunization: Secondary | ICD-10-CM | POA: Diagnosis not present

## 2018-03-08 DIAGNOSIS — R600 Localized edema: Secondary | ICD-10-CM | POA: Diagnosis not present

## 2018-03-28 DIAGNOSIS — R9431 Abnormal electrocardiogram [ECG] [EKG]: Secondary | ICD-10-CM | POA: Diagnosis not present

## 2018-03-29 DIAGNOSIS — R9431 Abnormal electrocardiogram [ECG] [EKG]: Secondary | ICD-10-CM | POA: Diagnosis not present

## 2018-04-02 DIAGNOSIS — M545 Low back pain: Secondary | ICD-10-CM | POA: Diagnosis not present

## 2018-04-02 DIAGNOSIS — M5441 Lumbago with sciatica, right side: Secondary | ICD-10-CM | POA: Diagnosis not present

## 2018-04-02 DIAGNOSIS — M25551 Pain in right hip: Secondary | ICD-10-CM | POA: Diagnosis not present

## 2018-04-05 DIAGNOSIS — R9431 Abnormal electrocardiogram [ECG] [EKG]: Secondary | ICD-10-CM | POA: Diagnosis not present

## 2018-04-05 DIAGNOSIS — R600 Localized edema: Secondary | ICD-10-CM | POA: Diagnosis not present

## 2018-04-10 DIAGNOSIS — Z79899 Other long term (current) drug therapy: Secondary | ICD-10-CM | POA: Diagnosis not present

## 2018-04-10 DIAGNOSIS — L400 Psoriasis vulgaris: Secondary | ICD-10-CM | POA: Diagnosis not present

## 2018-04-10 DIAGNOSIS — Z85828 Personal history of other malignant neoplasm of skin: Secondary | ICD-10-CM | POA: Diagnosis not present

## 2018-04-11 DIAGNOSIS — H401133 Primary open-angle glaucoma, bilateral, severe stage: Secondary | ICD-10-CM | POA: Diagnosis not present

## 2018-04-19 DIAGNOSIS — R351 Nocturia: Secondary | ICD-10-CM | POA: Diagnosis not present

## 2018-04-19 DIAGNOSIS — N401 Enlarged prostate with lower urinary tract symptoms: Secondary | ICD-10-CM | POA: Diagnosis not present

## 2018-05-08 DIAGNOSIS — Z125 Encounter for screening for malignant neoplasm of prostate: Secondary | ICD-10-CM | POA: Diagnosis not present

## 2018-05-08 DIAGNOSIS — R9431 Abnormal electrocardiogram [ECG] [EKG]: Secondary | ICD-10-CM | POA: Diagnosis not present

## 2018-05-08 DIAGNOSIS — E559 Vitamin D deficiency, unspecified: Secondary | ICD-10-CM | POA: Diagnosis not present

## 2018-05-08 DIAGNOSIS — R5383 Other fatigue: Secondary | ICD-10-CM | POA: Diagnosis not present

## 2018-06-27 DIAGNOSIS — L570 Actinic keratosis: Secondary | ICD-10-CM | POA: Diagnosis not present

## 2018-06-27 DIAGNOSIS — Z85828 Personal history of other malignant neoplasm of skin: Secondary | ICD-10-CM | POA: Diagnosis not present

## 2018-06-27 DIAGNOSIS — L821 Other seborrheic keratosis: Secondary | ICD-10-CM | POA: Diagnosis not present

## 2018-06-27 DIAGNOSIS — L400 Psoriasis vulgaris: Secondary | ICD-10-CM | POA: Diagnosis not present

## 2018-07-24 DIAGNOSIS — Z79899 Other long term (current) drug therapy: Secondary | ICD-10-CM | POA: Diagnosis not present

## 2018-07-24 DIAGNOSIS — L409 Psoriasis, unspecified: Secondary | ICD-10-CM | POA: Diagnosis not present

## 2018-07-24 DIAGNOSIS — Z5181 Encounter for therapeutic drug level monitoring: Secondary | ICD-10-CM | POA: Diagnosis not present

## 2018-07-24 DIAGNOSIS — R269 Unspecified abnormalities of gait and mobility: Secondary | ICD-10-CM | POA: Diagnosis not present

## 2018-07-24 DIAGNOSIS — M810 Age-related osteoporosis without current pathological fracture: Secondary | ICD-10-CM | POA: Diagnosis not present

## 2018-07-24 DIAGNOSIS — M8588 Other specified disorders of bone density and structure, other site: Secondary | ICD-10-CM | POA: Diagnosis not present

## 2018-07-24 DIAGNOSIS — Z9181 History of falling: Secondary | ICD-10-CM | POA: Diagnosis not present

## 2018-08-14 DIAGNOSIS — L409 Psoriasis, unspecified: Secondary | ICD-10-CM | POA: Diagnosis not present

## 2018-09-19 DIAGNOSIS — H401133 Primary open-angle glaucoma, bilateral, severe stage: Secondary | ICD-10-CM | POA: Diagnosis not present

## 2018-10-18 ENCOUNTER — Other Ambulatory Visit: Payer: Self-pay

## 2018-10-22 DIAGNOSIS — H401133 Primary open-angle glaucoma, bilateral, severe stage: Secondary | ICD-10-CM | POA: Diagnosis not present

## 2018-11-27 DIAGNOSIS — Z23 Encounter for immunization: Secondary | ICD-10-CM | POA: Diagnosis not present

## 2019-01-23 DIAGNOSIS — L301 Dyshidrosis [pompholyx]: Secondary | ICD-10-CM | POA: Diagnosis not present

## 2019-01-23 DIAGNOSIS — E559 Vitamin D deficiency, unspecified: Secondary | ICD-10-CM | POA: Diagnosis not present

## 2019-01-23 DIAGNOSIS — Z1159 Encounter for screening for other viral diseases: Secondary | ICD-10-CM | POA: Diagnosis not present

## 2019-01-28 DIAGNOSIS — M810 Age-related osteoporosis without current pathological fracture: Secondary | ICD-10-CM | POA: Diagnosis not present

## 2019-01-31 DIAGNOSIS — L400 Psoriasis vulgaris: Secondary | ICD-10-CM | POA: Diagnosis not present

## 2019-01-31 DIAGNOSIS — Z79899 Other long term (current) drug therapy: Secondary | ICD-10-CM | POA: Diagnosis not present

## 2019-01-31 DIAGNOSIS — Z85828 Personal history of other malignant neoplasm of skin: Secondary | ICD-10-CM | POA: Diagnosis not present

## 2019-01-31 DIAGNOSIS — D485 Neoplasm of uncertain behavior of skin: Secondary | ICD-10-CM | POA: Diagnosis not present

## 2019-02-04 DIAGNOSIS — L301 Dyshidrosis [pompholyx]: Secondary | ICD-10-CM | POA: Diagnosis not present

## 2019-02-04 DIAGNOSIS — E559 Vitamin D deficiency, unspecified: Secondary | ICD-10-CM | POA: Diagnosis not present

## 2019-02-04 DIAGNOSIS — E213 Hyperparathyroidism, unspecified: Secondary | ICD-10-CM | POA: Diagnosis not present

## 2019-02-04 DIAGNOSIS — Z1159 Encounter for screening for other viral diseases: Secondary | ICD-10-CM | POA: Diagnosis not present

## 2019-02-18 DIAGNOSIS — Z85828 Personal history of other malignant neoplasm of skin: Secondary | ICD-10-CM | POA: Diagnosis not present

## 2019-02-18 DIAGNOSIS — D485 Neoplasm of uncertain behavior of skin: Secondary | ICD-10-CM | POA: Diagnosis not present

## 2019-05-04 ENCOUNTER — Other Ambulatory Visit: Payer: Self-pay | Admitting: Urology

## 2020-04-07 ENCOUNTER — Encounter (INDEPENDENT_AMBULATORY_CARE_PROVIDER_SITE_OTHER): Payer: Self-pay | Admitting: Otolaryngology

## 2020-04-07 ENCOUNTER — Ambulatory Visit (INDEPENDENT_AMBULATORY_CARE_PROVIDER_SITE_OTHER): Payer: Medicare Other | Admitting: Otolaryngology

## 2020-04-07 ENCOUNTER — Other Ambulatory Visit: Payer: Self-pay

## 2020-04-07 VITALS — Temp 97.2°F

## 2020-04-07 DIAGNOSIS — H6123 Impacted cerumen, bilateral: Secondary | ICD-10-CM

## 2020-04-07 NOTE — Progress Notes (Signed)
HPI: Bradley Lewis is a 85 y.o. male who presents for evaluation of wax buildup in his ears referred by [facility] prior to getting evaluation for hearing aids.  He has large amount of wax buildup in both ear canals obstructing audiologic testing..  Past Medical History:  Diagnosis Date  . Arthritis   . Bladder calculi   . BPH (benign prostatic hyperplasia)   . GERD (gastroesophageal reflux disease)   . Glaucoma   . History of urinary retention   . PONV (postoperative nausea and vomiting)   . Psoriasis    Past Surgical History:  Procedure Laterality Date  . CATARACT EXTRACTION W/ INTRAOCULAR LENS  IMPLANT, BILATERAL  2010  . COLONOSCOPY    . CYSTOSCOPY WITH LITHOLAPAXY N/A 03/09/2015   Procedure: CYSTOSCOPY WITH CO2 LITHOLAPAXY;  Surgeon: Cleon Gustin, MD;  Location: [facility];  Service: Urology;  Laterality: N/A;  . RADIAL HEAD ARTHROPLASTY Left 04/11/2014   Procedure: LEFT RADIAL HEAD REPLACEMENT ;  Surgeon: Charlotte Crumb, MD;  Location: [place];  Service: Orthopedics;  Laterality: Left;  . TRANSURETHRAL RESECTION OF PROSTATE  11-06-2009  . TRANSURETHRAL RESECTION OF PROSTATE N/A 03/09/2015   Procedure: TRANSURETHRAL RESECTION OF THE PROSTATE WITH GYRUS INSTRUMENTS;  Surgeon: Cleon Gustin, MD;  Location[facility];  Service: Urology;  Laterality: N/A;   Social History   Socioeconomic History  . Marital status: Single    Spouse name: Not on file  . Number of children: Not on file  . Years of education: Not on file  . Highest education level: Not on file  Occupational History  . Not on file  Tobacco Use  . Smoking status: Never Smoker  . Smokeless tobacco: Never Used  Substance and Sexual Activity  . Alcohol use: Yes    Comment: occasional  . Drug use: No  . Sexual activity: Not on file  Other Topics Concern  . Not on file  Social History Narrative  . Not on file   Social Determinants of Health    Financial Resource Strain: Not on file  Food Insecurity: Not on file  Transportation Needs: Not on file  Physical Activity: Not on file  Stress: Not on file  Social Connections: Not on file   No family history on file. No Known Allergies Prior to Admission medications   Medication Sig Start Date End Date Taking? Authorizing Provider  Calcium Citrate-Vitamin D (CALCIUM + D PO) Take 1 capsule by mouth 2 (two) times daily.    [provider]  Cholecalciferol (VITAMIN D3) 5000 UNITS CAPS Take 5 capsules by mouth once a week.    [provider]  COMBIGAN 0.2-0.5 % ophthalmic solution Place 1 drop into both eyes 2 (two) times daily. 03/16/15   [provider]  finasteride (PROSCAR) 5 MG tablet TAKE 1 TABLET BY MOUTH EVERY DAY 05/07/19   McKenzie, Candee Furbish, MD  methotrexate (RHEUMATREX) 2.5 MG tablet Take 15 mg by mouth once a week. Caution:Chemotherapy. Protect from light.take 6 tablets On Thursdays.    [provider]  omeprazole (PRILOSEC) 20 MG capsule Take 20 mg by mouth every morning.     [provider]  oxyCODONE-acetaminophen (ROXICET) 5-325 MG tablet Take 1 tablet by mouth every 4 (four) hours as needed for severe pain. 03/09/15   McKenzie, Candee Furbish, MD  Teriparatide, Recombinant, 600 MCG/2.4ML SOLN Inject into the skin.    [provider]  timolol (BETIMOL) 0.25 % ophthalmic solution Place  1-2 drops into both eyes 2 (two) times daily.    [provider]  travoprost, benzalkonium, (TRAVATAN) 0.004 % ophthalmic solution Place 1 drop into both eyes at bedtime.    [provider]     Positive ROS: Otherwise negative  All other systems have been reviewed and were otherwise negative with the exception of those mentioned in the HPI and as above.  Physical Exam: Constitutional: Alert, well-appearing, no acute distress Ears: External ears without lesions or tenderness. Ear canals are completely obstructed with  cerumen that was removed with curette forceps and suction.  TMs were clear bilaterally.. Nasal: External nose without lesions. Clear nasal passages Oral: Oropharynx clear. Neck: No palpable adenopathy or masses Respiratory: Breathing comfortably  Skin: No facial/neck lesions or rash noted.  Cerumen impaction removal  Date/Time: 04/07/2020 1:05 PM Performed by: Rozetta Nunnery, MD Authorized by: Rozetta Nunnery, MD   Consent:    Consent obtained:  Verbal   Consent given by:  Patient   Risks discussed:  Pain and bleeding Procedure details:    Location:  L ear and R ear   Procedure type: curette, suction and forceps   Post-procedure details:    Inspection:  TM intact and canal normal   Hearing quality:  Improved   Patient tolerance of procedure:  Tolerated well, no immediate complications Comments:     TMs are clear bilaterally.    Assessment: Bilateral cerumen impactions  Plan: He will follow-up as needed. He will follow-up with [facility] concerning obtaining hearing test.  Radene Journey, MD

## 2020-07-16 ENCOUNTER — Other Ambulatory Visit: Payer: Self-pay | Admitting: Urology

## 2020-08-25 ENCOUNTER — Other Ambulatory Visit: Payer: Self-pay | Admitting: Urology

## 2021-05-19 ENCOUNTER — Emergency Department (HOSPITAL_COMMUNITY): Payer: Medicare Other

## 2021-05-19 ENCOUNTER — Observation Stay (HOSPITAL_COMMUNITY): Payer: Medicare Other

## 2021-05-19 ENCOUNTER — Other Ambulatory Visit: Payer: Self-pay

## 2021-05-19 ENCOUNTER — Encounter (HOSPITAL_COMMUNITY): Payer: Self-pay | Admitting: Internal Medicine

## 2021-05-19 ENCOUNTER — Inpatient Hospital Stay (HOSPITAL_COMMUNITY)
Admission: EM | Admit: 2021-05-19 | Discharge: 2021-05-23 | DRG: 175 | Disposition: A | Discharge status: Home / Self Care | Payer: Medicare Other | Source: Ambulatory Visit | Attending: Internal Medicine | Admitting: Internal Medicine

## 2021-05-19 DIAGNOSIS — N400 Enlarged prostate without lower urinary tract symptoms: Secondary | ICD-10-CM | POA: Diagnosis present

## 2021-05-19 DIAGNOSIS — I82462 Acute embolism and thrombosis of left calf muscular vein: Secondary | ICD-10-CM | POA: Diagnosis present

## 2021-05-19 DIAGNOSIS — Z9079 Acquired absence of other genital organ(s): Secondary | ICD-10-CM | POA: Diagnosis not present

## 2021-05-19 DIAGNOSIS — R140 Abdominal distension (gaseous): Secondary | ICD-10-CM | POA: Diagnosis present

## 2021-05-19 DIAGNOSIS — L409 Psoriasis, unspecified: Secondary | ICD-10-CM | POA: Diagnosis present

## 2021-05-19 DIAGNOSIS — R230 Cyanosis: Secondary | ICD-10-CM | POA: Diagnosis present

## 2021-05-19 DIAGNOSIS — M79605 Pain in left leg: Secondary | ICD-10-CM

## 2021-05-19 DIAGNOSIS — R634 Abnormal weight loss: Secondary | ICD-10-CM | POA: Diagnosis present

## 2021-05-19 DIAGNOSIS — I82432 Acute embolism and thrombosis of left popliteal vein: Secondary | ICD-10-CM | POA: Diagnosis present

## 2021-05-19 DIAGNOSIS — M79604 Pain in right leg: Secondary | ICD-10-CM | POA: Diagnosis not present

## 2021-05-19 DIAGNOSIS — Z20822 Contact with and (suspected) exposure to covid-19: Secondary | ICD-10-CM | POA: Diagnosis present

## 2021-05-19 DIAGNOSIS — I2699 Other pulmonary embolism without acute cor pulmonale: Secondary | ICD-10-CM | POA: Diagnosis not present

## 2021-05-19 DIAGNOSIS — M1990 Unspecified osteoarthritis, unspecified site: Secondary | ICD-10-CM | POA: Diagnosis present

## 2021-05-19 DIAGNOSIS — I824Z2 Acute embolism and thrombosis of unspecified deep veins of left distal lower extremity: Secondary | ICD-10-CM | POA: Diagnosis not present

## 2021-05-19 DIAGNOSIS — R0602 Shortness of breath: Secondary | ICD-10-CM

## 2021-05-19 DIAGNOSIS — Z6821 Body mass index (BMI) 21.0-21.9, adult: Secondary | ICD-10-CM

## 2021-05-19 DIAGNOSIS — Z79899 Other long term (current) drug therapy: Secondary | ICD-10-CM

## 2021-05-19 DIAGNOSIS — I2609 Other pulmonary embolism with acute cor pulmonale: Principal | ICD-10-CM

## 2021-05-19 DIAGNOSIS — I82452 Acute embolism and thrombosis of left peroneal vein: Secondary | ICD-10-CM | POA: Diagnosis present

## 2021-05-19 DIAGNOSIS — M7989 Other specified soft tissue disorders: Secondary | ICD-10-CM | POA: Diagnosis present

## 2021-05-19 DIAGNOSIS — I82412 Acute embolism and thrombosis of left femoral vein: Secondary | ICD-10-CM | POA: Diagnosis present

## 2021-05-19 DIAGNOSIS — E876 Hypokalemia: Secondary | ICD-10-CM | POA: Diagnosis not present

## 2021-05-19 DIAGNOSIS — I824Y2 Acute embolism and thrombosis of unspecified deep veins of left proximal lower extremity: Secondary | ICD-10-CM | POA: Diagnosis not present

## 2021-05-19 DIAGNOSIS — H409 Unspecified glaucoma: Secondary | ICD-10-CM | POA: Diagnosis present

## 2021-05-19 DIAGNOSIS — I82402 Acute embolism and thrombosis of unspecified deep veins of left lower extremity: Secondary | ICD-10-CM | POA: Diagnosis present

## 2021-05-19 LAB — ECHOCARDIOGRAM LIMITED
Height: 76 in
S' Lateral: 3.5 cm
Weight: 2880 oz

## 2021-05-19 LAB — CBC WITH DIFFERENTIAL/PLATELET
Abs Immature Granulocytes: 0.1 10*3/uL — ABNORMAL HIGH (ref 0.00–0.07)
Basophils Absolute: 0 10*3/uL (ref 0.0–0.1)
Basophils Relative: 0 %
Eosinophils Absolute: 0 10*3/uL (ref 0.0–0.5)
Eosinophils Relative: 0 %
HCT: 39 % (ref 39.0–52.0)
Hemoglobin: 12.4 g/dL — ABNORMAL LOW (ref 13.0–17.0)
Immature Granulocytes: 1 %
Lymphocytes Relative: 9 %
Lymphs Abs: 1 10*3/uL (ref 0.7–4.0)
MCH: 30.2 pg (ref 26.0–34.0)
MCHC: 31.8 g/dL (ref 30.0–36.0)
MCV: 95.1 fL (ref 80.0–100.0)
Monocytes Absolute: 1.6 10*3/uL — ABNORMAL HIGH (ref 0.1–1.0)
Monocytes Relative: 14 %
Neutro Abs: 8.4 10*3/uL — ABNORMAL HIGH (ref 1.7–7.7)
Neutrophils Relative %: 76 %
Platelets: 214 10*3/uL (ref 150–400)
RBC: 4.1 MIL/uL — ABNORMAL LOW (ref 4.22–5.81)
RDW: 17 % — ABNORMAL HIGH (ref 11.5–15.5)
WBC: 11.1 10*3/uL — ABNORMAL HIGH (ref 4.0–10.5)
nRBC: 0 % (ref 0.0–0.2)

## 2021-05-19 LAB — I-STAT CHEM 8, ED
BUN: 20 mg/dL (ref 8–23)
Calcium, Ion: 1.09 mmol/L — ABNORMAL LOW (ref 1.15–1.40)
Chloride: 105 mmol/L (ref 98–111)
Creatinine, Ser: 1.2 mg/dL (ref 0.61–1.24)
Glucose, Bld: 109 mg/dL — ABNORMAL HIGH (ref 70–99)
HCT: 42 % (ref 39.0–52.0)
Hemoglobin: 14.3 g/dL (ref 13.0–17.0)
Potassium: 3.7 mmol/L (ref 3.5–5.1)
Sodium: 136 mmol/L (ref 135–145)
TCO2: 21 mmol/L — ABNORMAL LOW (ref 22–32)

## 2021-05-19 LAB — HEPATIC FUNCTION PANEL
ALT: 20 U/L (ref 0–44)
AST: 32 U/L (ref 15–41)
Albumin: 3.3 g/dL — ABNORMAL LOW (ref 3.5–5.0)
Alkaline Phosphatase: 68 U/L (ref 38–126)
Bilirubin, Direct: 0.4 mg/dL — ABNORMAL HIGH (ref 0.0–0.2)
Indirect Bilirubin: 0.8 mg/dL (ref 0.3–0.9)
Total Bilirubin: 1.2 mg/dL (ref 0.3–1.2)
Total Protein: 6.8 g/dL (ref 6.5–8.1)

## 2021-05-19 LAB — CBC
HCT: 36.1 % — ABNORMAL LOW (ref 39.0–52.0)
Hemoglobin: 11.1 g/dL — ABNORMAL LOW (ref 13.0–17.0)
MCH: 29.2 pg (ref 26.0–34.0)
MCHC: 30.7 g/dL (ref 30.0–36.0)
MCV: 95 fL (ref 80.0–100.0)
Platelets: 220 10*3/uL (ref 150–400)
RBC: 3.8 MIL/uL — ABNORMAL LOW (ref 4.22–5.81)
RDW: 17.2 % — ABNORMAL HIGH (ref 11.5–15.5)
WBC: 8.3 10*3/uL (ref 4.0–10.5)
nRBC: 0 % (ref 0.0–0.2)

## 2021-05-19 LAB — I-STAT VENOUS BLOOD GAS, ED
Acid-base deficit: 4 mmol/L — ABNORMAL HIGH (ref 0.0–2.0)
Bicarbonate: 21.5 mmol/L (ref 20.0–28.0)
Calcium, Ion: 1.09 mmol/L — ABNORMAL LOW (ref 1.15–1.40)
HCT: 37 % — ABNORMAL LOW (ref 39.0–52.0)
Hemoglobin: 12.6 g/dL — ABNORMAL LOW (ref 13.0–17.0)
O2 Saturation: 69 %
Potassium: 3.5 mmol/L (ref 3.5–5.1)
Sodium: 135 mmol/L (ref 135–145)
TCO2: 23 mmol/L (ref 22–32)
pCO2, Ven: 38.3 mmHg — ABNORMAL LOW (ref 44–60)
pH, Ven: 7.357 (ref 7.25–7.43)
pO2, Ven: 37 mmHg (ref 32–45)

## 2021-05-19 LAB — RESP PANEL BY RT-PCR (FLU A&B, COVID) ARPGX2
Influenza A by PCR: NEGATIVE
Influenza B by PCR: NEGATIVE
SARS Coronavirus 2 by RT PCR: NEGATIVE

## 2021-05-19 LAB — HEPARIN LEVEL (UNFRACTIONATED): Heparin Unfractionated: 0.18 IU/mL — ABNORMAL LOW (ref 0.30–0.70)

## 2021-05-19 LAB — TROPONIN I (HIGH SENSITIVITY): Troponin I (High Sensitivity): 12 ng/L (ref <18)

## 2021-05-19 LAB — BRAIN NATRIURETIC PEPTIDE: B Natriuretic Peptide: 91.3 pg/mL (ref 0.0–100.0)

## 2021-05-19 MED ORDER — POLYETHYLENE GLYCOL 3350 17 G PO PACK: 17.0000 g | PACK | Freq: Every day | ORAL | Status: DC | PRN

## 2021-05-19 MED ORDER — TAMSULOSIN HCL 0.4 MG PO CAPS
0.4000 mg | ORAL_CAPSULE | Freq: Every day | ORAL | Status: DC
Start: 2021-05-20 — End: 2021-05-23
  Administered 2021-05-21: 0.4 mg via ORAL
  Filled 2021-05-19 (×4): qty 1

## 2021-05-19 MED ORDER — LATANOPROST 0.005 % OP SOLN
1.0000 [drp] | Freq: Every day | OPHTHALMIC | Status: DC
  Administered 2021-05-20 – 2021-05-22 (×4): 1 [drp] via OPHTHALMIC
  Filled 2021-05-19: qty 2.5

## 2021-05-19 MED ORDER — OXYCODONE HCL 5 MG PO TABS: 5.0000 mg | ORAL_TABLET | ORAL | Status: DC | PRN

## 2021-05-19 MED ORDER — SODIUM CHLORIDE 0.9% FLUSH
3.0000 mL | Freq: Two times a day (BID) | INTRAVENOUS | Status: DC
  Administered 2021-05-20 – 2021-05-23 (×5): 3 mL via INTRAVENOUS

## 2021-05-19 MED ORDER — DORZOLAMIDE HCL 2 % OP SOLN
1.0000 [drp] | Freq: Two times a day (BID) | OPHTHALMIC | Status: DC
  Administered 2021-05-20 – 2021-05-23 (×6): 1 [drp] via OPHTHALMIC
  Filled 2021-05-19 (×3): qty 10

## 2021-05-19 MED ORDER — METHOTREXATE 2.5 MG PO TABS
15.0000 mg | ORAL_TABLET | ORAL | Status: DC
  Administered 2021-05-20: 15 mg via ORAL
  Filled 2021-05-19: qty 6

## 2021-05-19 MED ORDER — HEPARIN BOLUS VIA INFUSION
5000.0000 [IU] | Freq: Once | INTRAVENOUS | Status: AC
  Administered 2021-05-19: 5000 [IU] via INTRAVENOUS
  Filled 2021-05-19: qty 5000

## 2021-05-19 MED ORDER — PANTOPRAZOLE SODIUM 40 MG PO TBEC
40.0000 mg | DELAYED_RELEASE_TABLET | Freq: Every day | ORAL | Status: DC
  Administered 2021-05-20 – 2021-05-23 (×4): 40 mg via ORAL
  Filled 2021-05-19 (×4): qty 1

## 2021-05-19 MED ORDER — MORPHINE SULFATE (PF) 2 MG/ML IV SOLN: 2.0000 mg | INTRAVENOUS | Status: DC | PRN

## 2021-05-19 MED ORDER — ACETAMINOPHEN 325 MG PO TABS
650.0000 mg | ORAL_TABLET | Freq: Four times a day (QID) | ORAL | Status: DC | PRN
  Administered 2021-05-20: 650 mg via ORAL
  Filled 2021-05-19: qty 2

## 2021-05-19 MED ORDER — HEPARIN (PORCINE) 25000 UT/250ML-% IV SOLN
2050.0000 [IU]/h | INTRAVENOUS | Status: DC
  Administered 2021-05-19: 1350 [IU]/h via INTRAVENOUS
  Administered 2021-05-21: 16:00:00 1900 [IU]/h via INTRAVENOUS
  Administered 2021-05-22: 2050 [IU]/h via INTRAVENOUS
  Filled 2021-05-19 (×5): qty 250

## 2021-05-19 MED ORDER — DOCUSATE SODIUM 100 MG PO CAPS
100.0000 mg | ORAL_CAPSULE | Freq: Two times a day (BID) | ORAL | Status: DC
  Administered 2021-05-20 – 2021-05-22 (×5): 100 mg via ORAL
  Filled 2021-05-19 (×7): qty 1

## 2021-05-19 MED ORDER — IOHEXOL 350 MG/ML SOLN
65.0000 mL | Freq: Once | INTRAVENOUS | Status: AC | PRN
  Administered 2021-05-19: 65 mL via INTRAVENOUS

## 2021-05-19 MED ORDER — ONDANSETRON HCL 4 MG/2ML IJ SOLN: 4.0000 mg | Freq: Four times a day (QID) | INTRAMUSCULAR | Status: DC | PRN

## 2021-05-19 MED ORDER — HEPARIN BOLUS VIA INFUSION
3000.0000 [IU] | Freq: Once | INTRAVENOUS | Status: AC
  Administered 2021-05-20: 3000 [IU] via INTRAVENOUS
  Filled 2021-05-19: qty 3000

## 2021-05-19 MED ORDER — HYDRALAZINE HCL 20 MG/ML IJ SOLN: 5.0000 mg | INTRAMUSCULAR | Status: DC | PRN

## 2021-05-19 MED ORDER — ALBUTEROL SULFATE (2.5 MG/3ML) 0.083% IN NEBU: 2.5000 mg | INHALATION_SOLUTION | RESPIRATORY_TRACT | Status: DC | PRN

## 2021-05-19 MED ORDER — BISACODYL 5 MG PO TBEC: 5.0000 mg | DELAYED_RELEASE_TABLET | Freq: Every day | ORAL | Status: DC | PRN

## 2021-05-19 MED ORDER — OXYCODONE-ACETAMINOPHEN 5-325 MG PO TABS
1.0000 | ORAL_TABLET | ORAL | Status: DC | PRN
Start: 2021-05-19 — End: 2021-05-23

## 2021-05-19 MED ORDER — FINASTERIDE 5 MG PO TABS
5.0000 mg | ORAL_TABLET | Freq: Every day | ORAL | Status: DC
  Administered 2021-05-19 – 2021-05-23 (×5): 5 mg via ORAL
  Filled 2021-05-19 (×6): qty 1

## 2021-05-19 MED ORDER — ACETAMINOPHEN 650 MG RE SUPP: 650.0000 mg | Freq: Four times a day (QID) | RECTAL | Status: DC | PRN

## 2021-05-19 MED ORDER — BRIMONIDINE TARTRATE-TIMOLOL 0.2-0.5 % OP SOLN
1.0000 [drp] | Freq: Two times a day (BID) | OPHTHALMIC | Status: DC
  Administered 2021-05-20 – 2021-05-23 (×6): 1 [drp] via OPHTHALMIC
  Filled 2021-05-19 (×3): qty 5

## 2021-05-19 MED ORDER — ONDANSETRON HCL 4 MG PO TABS: 4.0000 mg | ORAL_TABLET | Freq: Four times a day (QID) | ORAL | Status: DC | PRN

## 2021-05-19 NOTE — Assessment & Plan Note (Addendum)
-  Patient without prior episodes of thromboembolic disease, no recent surgeries, no recent long trips, presenting with new LLE DVT/PE ?-Patient is not showing evidence of hemodynamic instability at this time although blood pressure is soft. ?-Given his hemodynamic stability, his is at intermediate risk ?-PESI score is Class III, intermediate risk, indicating a 3.2-7.1% 30-day mortality risk ?-S-PESI score is high, indicating that the patient has a 8.9 risk of death  ?-CT chest done with bilateral PE with right heart strain noted -2D echo with McConnell sign.   ?-Lower extremity Dopplers consistent with left lower extremity DVT.  ?-Cardiac enzymes negative. ?-With high PESI/S-PESI and R heart strain, he is at intermediate risk at a minimum; and as such IR consulted for consideration for intervention.  ?-Patient seen in consultation by IR, patient noted to be hemodynamically stable and at this time did not recommend any intervention and recommending only anticoagulation with IV heparin.   ?-[place] O2 as needed, currently on RA with sats of 95%. ?-It was noted by IR that RN stated patient desaturated with ambulation on 05/21/2021. ?-Admitting physician, Dr. Lorin Mercy discussed oral [facility] treatment options and risks/benefits including frequent MD visits and dietary regulation with Coumadin vs. Significant expense with DOAC therapy. ?-Patient hemodynamically stable, respiratory status improved currently 100% on room air. -Patients are at intermediate risk (3-8%/year) for recurrent VTE if initial clot occurred with no identifiable RF.  Extended oral anticoagulation of indefinite duration should be considered for patients with a first episode of PE with these issues. He is interested in maintaining a very active lifestyle and prefers to limit AC to as short a duration as possible ?-The patient understands that thromboembolic disease can be catastrophic and even deadly and that he must be complaint with physician appointments and  anticoagulation. ?-Due to soft blood pressure patient placed on gentle hydration with improvement with blood pressure.  ?-IR following and appreciate input and recommendations ?-PCCM consulted for further evaluation and recommendations in terms of lytics.  It is noted per PCCM note that patient did show some unintentional weight loss and abdominal bloating sensation for which she takes probiotics. ?-PCCM assessed the patient and PSA obtained at 0.89, CEA 1.0, CA 19-9 was 18.   ?-CT abdomen and pelvis obtained showed mural thickening in the gastric cardia, PCCM recommended evaluation by GI for probable upper endoscopy at least 6 weeks post discharge.   ?-PCCM feels patient will likely need lifelong use of anticoagulation unless a contraindication given unprovoked nature of the clot.  ?-Per PCCM if patient still short of breath in 3 months from now will need a repeat 2D echo.  ?- Transitioned from IV heparin to Eliquis.  ?-Patient tolerated Eliquis. ?-Patient will be discharged home on Eliquis with outpatient follow-up with PCP. ?

## 2021-05-19 NOTE — ED Provider Notes (Signed)
[facility] EMERGENCY DEPARTMENT Provider Note   CSN: [id number] Arrival date & time: 05/19/21  1133     History  Chief Complaint  Patient presents with   Shortness of Breath    3 days    Bradley Lewis is a 86 y.o. male.  HPI Elderly male presenting by EMS for evaluation of shortness of breath.  No recent medical contacts for same.  No documented history of acute cardiac or pulmonary disorders.  EMS reported cyanosis, that improved when they placed him on nasal cannula oxygen.  Patient apparently went to his PCP today to be evaluated for this problem.  Per nursing, the PCP was concerned about left leg swelling and a possible DVT.  Patient was not sure what the PCP was concerned about.  He is here with his fiance.  He denies prior similar problems.  He reports that his shortness of breath is primarily with exertion and has been present for 3 days.    Home Medications Prior to Admission medications   Medication Sig Start Date End Date Taking? Authorizing Provider  Calcium Citrate-Vitamin D (CALCIUM + D PO) Take 1 capsule by mouth 2 (two) times daily.    [provider]  Cholecalciferol (VITAMIN D3) 5000 UNITS CAPS Take 5 capsules by mouth once a week.    [provider]  COMBIGAN 0.2-0.5 % ophthalmic solution Place 1 drop into both eyes 2 (two) times daily. 03/16/15   [provider]  finasteride (PROSCAR) 5 MG tablet TAKE 1 TABLET BY MOUTH EVERY DAY 07/20/20   McKenzie, Candee Furbish, MD  methotrexate (RHEUMATREX) 2.5 MG tablet Take 15 mg by mouth once a week. Caution:Chemotherapy. Protect from light.take 6 tablets On Thursdays.    [provider]  omeprazole (PRILOSEC) 20 MG capsule Take 20 mg by mouth every morning.     [provider]  oxyCODONE-acetaminophen (ROXICET) 5-325 MG tablet Take 1 tablet by mouth every 4 (four) hours as needed for severe pain. 03/09/15   McKenzie, Candee Furbish, MD  silodosin (RAPAFLO) 8 MG CAPS capsule  TAKE 1 CAPSULE BY MOUTH EVERY DAY 08/27/20   McKenzie, Candee Furbish, MD  Teriparatide, Recombinant, 600 MCG/2.4ML SOLN Inject into the skin.    [provider]  timolol (BETIMOL) 0.25 % ophthalmic solution Place 1-2 drops into both eyes 2 (two) times daily.    [provider]  travoprost, benzalkonium, (TRAVATAN) 0.004 % ophthalmic solution Place 1 drop into both eyes at bedtime.    [provider]      Allergies    Patient has no known allergies.    Review of Systems   Review of Systems  Physical Exam Updated Vital Signs BP 133/84    Pulse 75    Temp 98 F (36.7 C) (Oral)    Resp 18    Ht 6\' 4"  (1.93 m)    Wt 81.6 kg    SpO2 99%    BMI 21.91 kg/m  Physical Exam Vitals and nursing note reviewed.  Constitutional:      General: He is not in acute distress.    Appearance: He is well-developed. He is not ill-appearing or diaphoretic.  HENT:     Head: Normocephalic and atraumatic.     Right Ear: External ear normal.     Left Ear: External ear normal.     Nose: No congestion or rhinorrhea.  Eyes:     Conjunctiva/sclera: Conjunctivae normal.     Pupils: Pupils are equal, round, and  reactive to light.  Neck:     Trachea: Phonation normal.  Cardiovascular:     Rate and Rhythm: Normal rate and regular rhythm.     Heart sounds: Normal heart sounds.  Pulmonary:     Effort: Pulmonary effort is normal. No respiratory distress.     Breath sounds: Normal breath sounds. No stridor. No wheezing or rhonchi.     Comments: No respiratory distress Abdominal:     General: There is no distension.     Palpations: Abdomen is soft.     Tenderness: There is no abdominal tenderness.  Musculoskeletal:        General: Normal range of motion.     Cervical back: Normal range of motion and neck supple.     Left lower leg: Edema present.     Comments: Asymmetric lower leg edema, left.  No significant tenderness with palpation of the left calf or shin.  No large joint deformities.   Skin:    General: Skin is warm and dry.     Comments: Somewhat blue discoloration of lips.  Neurological:     Mental Status: He is alert and oriented to person, place, and time.     Cranial Nerves: No cranial nerve deficit.     Sensory: No sensory deficit.     Motor: No abnormal muscle tone.     Coordination: Coordination normal.  Psychiatric:        Behavior: Behavior normal.        Thought Content: Thought content normal.        Judgment: Judgment normal.    ED Results / Procedures / Treatments   Labs (all labs ordered are listed, but only abnormal results are displayed) Labs Reviewed  CBC WITH DIFFERENTIAL/PLATELET - Abnormal; Notable for the following components:      Result Value   WBC 11.1 (*)    RBC 4.10 (*)    Hemoglobin 12.4 (*)    RDW 17.0 (*)    Neutro Abs 8.4 (*)    Monocytes Absolute 1.6 (*)    Abs Immature Granulocytes 0.10 (*)    All other components within normal limits  HEPATIC FUNCTION PANEL - Abnormal; Notable for the following components:   Albumin 3.3 (*)    Bilirubin, Direct 0.4 (*)    All other components within normal limits  I-STAT VENOUS BLOOD GAS, ED - Abnormal; Notable for the following components:   pCO2, Ven 38.3 (*)    Acid-base deficit 4.0 (*)    Calcium, Ion 1.09 (*)    HCT 37.0 (*)    Hemoglobin 12.6 (*)    All other components within normal limits  I-STAT CHEM 8, ED - Abnormal; Notable for the following components:   Glucose, Bld 109 (*)    Calcium, Ion 1.09 (*)    TCO2 21 (*)    All other components within normal limits  RESP PANEL BY RT-PCR (FLU A&B, COVID) [id number]  HEPARIN LEVEL (UNFRACTIONATED)  CBC    EKG EKG Interpretation  Date/Time:  Wednesday May 19 2021 11:39:11 EST Ventricular Rate:  74 PR Interval:  146 QRS Duration: 117 QT Interval:  412 QTC Calculation: 458 R Axis:   37 Text Interpretation: Sinus rhythm Nonspecific intraventricular conduction delay since last tracing no significant change Confirmed by  Daleen Bo [phone]) on 05/19/2021 1:09:59 PM  Radiology CT Angio Chest PE W/Cm &/Or Wo Cm  Result Date: 05/19/2021 CLINICAL DATA:  Pulmonary embolus EXAM: CT ANGIOGRAPHY CHEST WITH CONTRAST TECHNIQUE: Multidetector CT imaging  of the chest was performed using the standard protocol during bolus administration of intravenous contrast. Multiplanar CT image reconstructions and MIPs were obtained to evaluate the vascular anatomy. RADIATION DOSE REDUCTION: This exam was performed according to the departmental dose-optimization program which includes automated exposure control, adjustment of the mA and/or kV according to patient size and/or use of iterative reconstruction technique. CONTRAST:  56mL OMNIPAQUE IOHEXOL 350 MG/ML SOLN COMPARISON:  None. FINDINGS: Cardiovascular: Pulmonary embolus of the distal right pulmonary artery, right lobar arteries and segmental/subsegmental pulmonary arteries of the right lung and left lower lobe. Evidence of right heart strain with increased RV to LV ratio. Normal heart size. No pericardial effusion. Mild coronary artery calcifications of the LAD. Atherosclerotic disease of the thoracic aorta. Mediastinum/Nodes: Esophagus and thyroid are unremarkable. No pathologically enlarged lymph nodes seen in the chest. Lungs/Pleura: Central airways are patent. Mild patchy ground-glass opacities of the right lower lobe and right upper lobe. Bibasilar atelectasis. No consolidation, pleural effusion or pneumothorax. Upper Abdomen: Simple cyst of the left kidney and prominent left extrarenal pelvis no acute abnormality. Musculoskeletal: No chest wall abnormality. No acute or significant osseous findings. Review of the MIP images confirms the above findings. IMPRESSION: 1. Pulmonary embolus of the distal right pulmonary artery, right lobar arteries and segmental/subsegmental pulmonary arteries of the right lung and left lower lobe 2. CT evidence of right heart strain. 3. Mild patchy ground-glass  opacities of the right lower lobe and right upper lobe, likely infectious or inflammatory. 4. Coronary artery calcifications and aortic Atherosclerosis (ICD10-I70.0). Critical Value/emergent results were called by telephone at the time of interpretation on 05/19/2021 at 2:32 pm to provider [facility] , who verbally acknowledged these results. Electronically Signed   By: Yetta Glassman M.D.   On: 05/19/2021 14:35   DG Chest Port 1 View  Result Date: 05/19/2021 CLINICAL DATA:  Shortness of breath. EXAM: PORTABLE CHEST 1 VIEW COMPARISON:  None. FINDINGS: The heart size and mediastinal contours are within normal limits. Both lungs are clear. The visualized skeletal structures are unremarkable. IMPRESSION: No active disease. Electronically Signed   By: Marijo Conception M.D.   On: 05/19/2021 12:11    Procedures Procedures    Medications Ordered in ED Medications  heparin ADULT infusion 100 units/mL (25000 units/274mL) (has no administration in time range)  heparin bolus via infusion 5,000 Units (has no administration in time range)  iohexol (OMNIPAQUE) 350 MG/ML injection 65 mL (65 mLs Intravenous Contrast Given 05/19/21 1421)    ED Course/ Medical Decision Making/ A&P Clinical Course as of 05/19/21 1444  Wed May 19, 2021  1429 CT angio chest results, discussed with radiology reading the images.  Patient has large clot burden, right lower lung arterial supply.  Also evidence for right heart strain. [EW]  1438 He remains comfortable, with respiratory rate somewhat lower.  Doppler imaging being done at this time left lower leg indicating clot from pelvis to lower leg. [EW]    Clinical Course User Index [EW] Daleen Bo, MD                           Medical Decision Making Patient presenting with reported cyanosis, and complaint of dyspnea on exertion with left leg swelling.  Concern for venous thromboembolism and pulmonary embolism is present.  Patient will require evaluation for PE and DVT.   Labs ordered to evaluate current status.   Problems Addressed: SOB (shortness of breath): acute illness or injury that  poses a threat to life or bodily functions    Details: 3 days of dyspnea on exertion.  Unilateral leg swelling concerning for venous thromboembolism/PE.  Amount and/or Complexity of Data Reviewed Independent Historian:     Details: He is a cogent historian Labs: ordered. Radiology: ordered and independent interpretation performed.    Details: Chest x-ray, CT angio chest-no infiltrate.  Large PE, right lung.  Right heart strain present.  Right lung inflammatory/infectious process, present. ECG/medicine tests: ordered.    Details: Cardiac monitor-normal sinus rhythm Discussion of management or test interpretation with external provider(s): Consultation radiology regarding CT images of the chest.  Consultation hospitalist for admission.  Risk Prescription drug management. Risk Details: Patient presenting with leg swelling, shortness of breath and cough.  Patient with large right-sided PE, left  leg DVT, and infectious process, right lung.  Doubt sepsis.  Doubt acute congestive heart failure.  IV heparin ordered, to prevent progression.  With large left leg clot he may require vascular intervention, subacutely to prevent long-term disability from venous insufficiency.  Additional chest CT abnormality of inflammatory versus infectious process is nonspecific.  He does not have overt infectious symptoms.  Critical Care Total time providing critical care: 30-74 minutes          Final Clinical Impression(s) / ED Diagnoses Final diagnoses:  SOB (shortness of breath)  Other acute pulmonary embolism with acute cor pulmonale (HCC)  Acute deep vein thrombosis (DVT) of distal vein of left lower extremity ([place])    Rx / DC Orders ED Discharge Orders     None         Daleen Bo, MD 05/19/21 1512

## 2021-05-19 NOTE — Progress Notes (Addendum)
ANTICOAGULATION CONSULT NOTE - Initial Consult ? ?Pharmacy Consult for heparin ?Indication: pulmonary embolus ? ?No Known Allergies ? ?Patient Measurements: ?Height: 6\' 4"  (193 cm) ?Weight: 81.6 kg (180 lb) ?IBW/kg (Calculated) : 86.8 ?Heparin Dosing Weight: 81.6 kg ? ?Vital Signs: ?Temp: 98 ?F (36.7 ?C) (03/01 1141) ?Temp Source: Oral (03/01 1141) ?BP: 132/80 (03/01 1141) ?Pulse Rate: 71 (03/01 1141) ? ?Labs: ?Recent Labs  ?  05/19/21 ?1146 05/19/21 ?1156 05/19/21 ?1159  ?HGB 12.4* 12.6* 14.3  ?HCT 39.0 37.0* 42.0  ?PLT 214  --   --   ?CREATININE  --   --  1.20  ? ? ?Estimated Creatinine Clearance: 53.8 mL/min (by C-G formula based on SCr of 1.2 mg/dL). ? ? ?Medical History: ?Past Medical History:  ?Diagnosis Date  ? Arthritis   ? Bladder calculi   ? BPH (benign prostatic hyperplasia)   ? GERD (gastroesophageal reflux disease)   ? Glaucoma   ? History of urinary retention   ? PONV (postoperative nausea and vomiting)   ? Psoriasis   ? ? ?Medications:  ?(Not in a hospital admission)  ? ?Assessment: ?Patient is an 86 yo male presenting with shortness of breath for 3 days. Pharmacy is consulted to initiate heparin for PE with evidence of right heart strain. Patient not on anticoagulation PTA. CrCl 53 ml/min. Will continue to follow plans for anticoagulation.  ? ?Goal of Therapy:  ?Heparin level 0.3-0.7 units/ml ?Monitor platelets by anticoagulation protocol: Yes ?  ?Plan:  ?Bolus with heparin 5000 units ?Start heparin infusion at 1350 units/hr ?Check 8 hour heparin level and daily heparin level ? ?Thank you for allowing pharmacy to participate in this patient's care. ? ?Reatha Harps, PharmD ?PGY1 Pharmacy Resident ?05/19/2021 2:38 PM ?Check [email or web address] for unit specific pharmacy number ? ? ? ?

## 2021-05-19 NOTE — Progress Notes (Signed)
Lower extremity venous has been completed.  ? ?Preliminary results in CV Proc.  ? ?Kastin Cerda Meiya Wisler ?05/19/2021 3:16 PM    ?

## 2021-05-19 NOTE — Assessment & Plan Note (Addendum)
-   Patient maintained on home regimen of latanoprost, dorzolamide, Combigan ?

## 2021-05-19 NOTE — Assessment & Plan Note (Addendum)
-   Patient maintained on home regimen of proscar, rapaflo (Flomax per formulary) ?

## 2021-05-19 NOTE — Progress Notes (Signed)
ANTICOAGULATION CONSULT NOTE  ?Pharmacy Consult for heparin ?Indication: pulmonary embolus ? ?No Known Allergies ? ?Patient Measurements: ?Height: 6\' 4"  (193 cm) ?Weight: 81.6 kg (180 lb) ?IBW/kg (Calculated) : 86.8 ?Heparin Dosing Weight: 81.6 kg ? ?Vital Signs: ?Temp: 98 ?F (36.7 ?C) (03/01 1141) ?Temp Source: Oral (03/01 1141) ?BP: 131/74 (03/01 1700) ?Pulse Rate: 71 (03/01 1700) ? ?Labs: ?Recent Labs  ?  05/19/21 ?1146 05/19/21 ?1156 05/19/21 ?1159 05/19/21 ?2033  ?HGB 12.4* 12.6* 14.3 11.1*  ?HCT 39.0 37.0* 42.0 36.1*  ?PLT 214  --   --  220  ?HEPARINUNFRC  --   --   --  0.18*  ?CREATININE  --   --  1.20  --   ?TROPONINIHS  --   --   --  12  ? ? ? ?Estimated Creatinine Clearance: 53.8 mL/min (by C-G formula based on SCr of 1.2 mg/dL). ? ? ?Medical History: ?Past Medical History:  ?Diagnosis Date  ? Arthritis   ? Bladder calculi   ? BPH (benign prostatic hyperplasia)   ? GERD (gastroesophageal reflux disease)   ? Glaucoma   ? History of urinary retention   ? PONV (postoperative nausea and vomiting)   ? Psoriasis   ? ? ?Medications:  ?(Not in a hospital admission) ? ?Assessment: ?Patient is an 86 yo male presenting with shortness of breath for 3 days. Pharmacy is consulted to initiate heparin for PE with evidence of right heart strain. Patient not on anticoagulation PTA. CrCl 53 ml/min.  ? ?Initial heparin level low at 0.18. Hgb down to 11.1 on recheck. Will titrate and recheck in am.  ? ? ?Goal of Therapy:  ?Heparin level 0.3-0.7 units/ml ?Monitor platelets by anticoagulation protocol: Yes ?  ?Plan:  ?Bolus with heparin 3000 units ?Increase heparin infusion to 1550 units/hr ?Check 8 hour heparin level and daily heparin level ? ?Thank you for allowing pharmacy to participate in this patient's care. ? ?Erin Hearing PharmD., BCPS ?Clinical Pharmacist ?05/19/2021 11:03 PM ? ?

## 2021-05-19 NOTE — ED Triage Notes (Signed)
Pt arrived via [facility] from PCP. Per EMS, pt c/o SOB x3 days along with pain and swelling in the L leg worsening over the past 3 days. EMS further reports pt was cyanotic in the face on their arrival which improved with O2 via [place]. Pt arrived to ED caox4, in no obvious distress, denying pain, pt still has cyanosis in the lips.  ?

## 2021-05-19 NOTE — Consult Note (Signed)
Vascular and Interventional Radiology  ?On Call / Brief Consult Note ? ?Patient: Bradley Lewis ?DOB: [birth year] ?Medical Record Number: [id number] ?Note Date/Time: 05/19/21 7:45 PM  ? ?Admitting Diagnosis: Acute pulmonary embolism with acute cor pulmonale ([place]) [I26.09]  ?1. SOB (shortness of breath)   ?2. Other acute pulmonary embolism with acute cor pulmonale ([place])   ?3. Acute deep vein thrombosis (DVT) of distal vein of left lower extremity ([place])   ?  ?Imaging: ? ?CTA PE, 05/19/21 ?Imaging independently reviewed demonstrating PE with greatest burden on R ? ? ? ?BLE Venous Duplex, 05/19/21 ?Positive for LLE DVT ? ?Assessment  Plan: ? ?86 y.o. year old male whom Primary MD reached out to [place] On Call Physician with concern for PE, with CTA and TTE evidence of R heart strain. ? ?sPESI = 1, High risk secondary to age >24 yrs ?VSS / HDS ?BNP / Trop pending. ? ?-  No acute VIR intervention at this time. ?- Systemic AC w Hep gtt per Primary. ?- VIR will continue to follow in the event the Pt decompensates. ? ? ?Thank you for allowing [place] to participate in the care of your Patient. ?Please contact the [place] Team with questions, concerns, or if new issues arise. ? ? ?Michaelle Birks, MD ?[facility] ?[facility] ? ? ?Pager. [phone] ?Clinic. [phone] ? ? ?

## 2021-05-19 NOTE — ED Notes (Signed)
Admitting doctor at  the bedside dr yates.  Pulse wire not picking up  the pts fingers are cold  pulse ox  wire moved to the forehead ?

## 2021-05-19 NOTE — ED Notes (Signed)
Pt at CT

## 2021-05-19 NOTE — H&P (Signed)
History and Physical    Patient: Bradley Lewis KZS:[id number] DOB: [birth year] DOA: 05/19/2021 DOS: the patient was seen and examined on 05/19/2021 PCP: [name], MD  Patient coming from: Home - lives alone; NOK: Eliezer, Khawaja, [phone]   Chief Complaint: SOB  HPI: Bradley Lewis is a 86 y.o. male with medical history significant of BPH and glaucoma presenting with SOB.  He reports that he wasn't feeling well and had swelling in his leg and SOB.  He wasn't able to function as he usually does.  Symptoms were gradual and he is not sure how long it has gone on.  He saw his PCP this AM and was sent by ambulance to the ER.  He is very slow to recover, particularly when going up stairs.  He walks 8 miles a week and goes to the gym twice a week.  He also golfs 3 days a week.  His last colonoscopy was at 40.  He gets his prostate checked routinely.  He had some stomach issues last fall and he was placed on probiotics with improvement.  He did lose weight as a result.  He hasn't been able to gain it back.    ER Course:  LLE DVT with PE.  Mild heart strain, not on O2.       Review of Systems: As mentioned in the history of present illness. All other systems reviewed and are negative. Past Medical History:  Diagnosis Date   Arthritis    Bladder calculi    BPH (benign prostatic hyperplasia)    GERD (gastroesophageal reflux disease)    Glaucoma    History of urinary retention    PONV (postoperative nausea and vomiting)    Psoriasis    Past Surgical History:  Procedure Laterality Date   CATARACT EXTRACTION W/ INTRAOCULAR LENS  IMPLANT, BILATERAL  2010   COLONOSCOPY     CYSTOSCOPY WITH LITHOLAPAXY N/A 03/09/2015   Procedure: CYSTOSCOPY WITH CO2 LITHOLAPAXY;  Surgeon: Cleon Gustin, MD;  Location: [facility];  Service: Urology;  Laterality: N/A;   RADIAL HEAD ARTHROPLASTY Left 04/11/2014   Procedure: LEFT RADIAL HEAD REPLACEMENT ;  Surgeon: Charlotte Crumb, MD;   Location: [place];  Service: Orthopedics;  Laterality: Left;   TRANSURETHRAL RESECTION OF PROSTATE  11-06-2009   TRANSURETHRAL RESECTION OF PROSTATE N/A 03/09/2015   Procedure: TRANSURETHRAL RESECTION OF THE PROSTATE WITH GYRUS INSTRUMENTS;  Surgeon: Cleon Gustin, MD;  Location: [facility];  Service: Urology;  Laterality: N/A;   Social History:  reports that he has never smoked. He has never used smokeless tobacco. He reports current alcohol use. He reports that he does not use drugs.  No Known Allergies  History reviewed. No pertinent family history.  Prior to Admission medications   Medication Sig Start Date End Date Taking? Authorizing Provider  Calcium Citrate-Vitamin D (CALCIUM + D PO) Take 1 capsule by mouth 2 (two) times daily.    [provider]  Cholecalciferol (VITAMIN D3) 5000 UNITS CAPS Take 5 capsules by mouth once a week.    [provider]  COMBIGAN 0.2-0.5 % ophthalmic solution Place 1 drop into both eyes 2 (two) times daily. 03/16/15   [provider]  finasteride (PROSCAR) 5 MG tablet TAKE 1 TABLET BY MOUTH EVERY DAY 07/20/20   McKenzie, Candee Furbish, MD  methotrexate (RHEUMATREX) 2.5 MG tablet Take 15 mg by mouth once a week. Caution:Chemotherapy. Protect from light.take 6 tablets On Thursdays.  [provider]  omeprazole (PRILOSEC) 20 MG capsule Take 20 mg by mouth every morning.     [provider]  oxyCODONE-acetaminophen (ROXICET) 5-325 MG tablet Take 1 tablet by mouth every 4 (four) hours as needed for severe pain. 03/09/15   McKenzie, Candee Furbish, MD  silodosin (RAPAFLO) 8 MG CAPS capsule TAKE 1 CAPSULE BY MOUTH EVERY DAY 08/27/20   McKenzie, Candee Furbish, MD  Teriparatide, Recombinant, 600 MCG/2.4ML SOLN Inject into the skin.    [provider]  timolol (BETIMOL) 0.25 % ophthalmic solution Place 1-2 drops into both eyes 2 (two) times daily.    [provider]  travoprost,  benzalkonium, (TRAVATAN) 0.004 % ophthalmic solution Place 1 drop into both eyes at bedtime.    [provider]    Physical Exam: Vitals:   05/19/21 1540 05/19/21 1600 05/19/21 1630 05/19/21 1700  BP:  132/75 138/72 131/74  Pulse: (!) 25 68 71 71  Resp: 16 20 20 20   Temp:      TempSrc:      SpO2: (!) 80% 97% 99% 99%  Weight:      Height:       General:  Appears calm and comfortable and is in NAD, on RA Eyes:  PERRL, EOMI, normal lids, iris ENT:  grossly normal hearing, lips & tongue, mmm; appropriate dentition Neck:  no LAD, masses or thyromegaly Cardiovascular:  RRR, no m/r/g.  Respiratory:   CTA bilaterally with no wheezes/rales/rhonchi.  Normal respiratory effort on RA. Abdomen:  soft, NT, ND Skin:  LLE is larger and erythematous compared to R   Musculoskeletal:  grossly normal tone BUE/BLE, good ROM, no bony abnormality Lower extremity:  Limited foot exam with no ulcerations.  2+ distal pulses. Psychiatric:  grossly normal mood and affect, speech fluent and appropriate, AOx3 Neurologic:  CN 2-12 grossly intact, moves all extremities in coordinated fashion   Radiological Exams on Admission: Independently reviewed - see discussion in A/P where applicable  CT Angio Chest PE W/Cm &/Or Wo Cm  Result Date: 05/19/2021 CLINICAL DATA:  Pulmonary embolus EXAM: CT ANGIOGRAPHY CHEST WITH CONTRAST TECHNIQUE: Multidetector CT imaging of the chest was performed using the standard protocol during bolus administration of intravenous contrast. Multiplanar CT image reconstructions and MIPs were obtained to evaluate the vascular anatomy. RADIATION DOSE REDUCTION: This exam was performed according to the departmental dose-optimization program which includes automated exposure control, adjustment of the mA and/or kV according to patient size and/or use of iterative reconstruction technique. CONTRAST:  66mL OMNIPAQUE IOHEXOL 350 MG/ML SOLN COMPARISON:  None. FINDINGS: Cardiovascular:  Pulmonary embolus of the distal right pulmonary artery, right lobar arteries and segmental/subsegmental pulmonary arteries of the right lung and left lower lobe. Evidence of right heart strain with increased RV to LV ratio. Normal heart size. No pericardial effusion. Mild coronary artery calcifications of the LAD. Atherosclerotic disease of the thoracic aorta. Mediastinum/Nodes: Esophagus and thyroid are unremarkable. No pathologically enlarged lymph nodes seen in the chest. Lungs/Pleura: Central airways are patent. Mild patchy ground-glass opacities of the right lower lobe and right upper lobe. Bibasilar atelectasis. No consolidation, pleural effusion or pneumothorax. Upper Abdomen: Simple cyst of the left kidney and prominent left extrarenal pelvis no acute abnormality. Musculoskeletal: No chest wall abnormality. No acute or significant osseous findings. Review of the MIP images confirms the above findings. IMPRESSION: 1. Pulmonary embolus of the distal right pulmonary artery, right lobar arteries and segmental/subsegmental pulmonary arteries of the right lung and left lower lobe 2. CT evidence  of right heart strain. 3. Mild patchy ground-glass opacities of the right lower lobe and right upper lobe, likely infectious or inflammatory. 4. Coronary artery calcifications and aortic Atherosclerosis (ICD10-I70.0). Critical Value/emergent results were called by telephone at the time of interpretation on 05/19/2021 at 2:32 pm to provider [facility] , who verbally acknowledged these results. Electronically Signed   By: Yetta Glassman M.D.   On: 05/19/2021 14:35   DG Chest Port 1 View  Result Date: 05/19/2021 CLINICAL DATA:  Shortness of breath. EXAM: PORTABLE CHEST 1 VIEW COMPARISON:  None. FINDINGS: The heart size and mediastinal contours are within normal limits. Both lungs are clear. The visualized skeletal structures are unremarkable. IMPRESSION: No active disease. Electronically Signed   By: Marijo Conception M.D.    On: 05/19/2021 12:11   VAS [place] LOWER EXTREMITY VENOUS (DVT) (7a-7p)  Result Date: 05/19/2021  Lower Venous DVT Study Patient Name:  Bradley Lewis  Date of Exam:   05/19/2021 Medical Rec #: [id number]      Accession #:    [id number] Date of Birth: 09-13-1935      Patient Gender: M Patient Age:   55 years Exam Location:  [facility] Procedure:      VAS [place] LOWER EXTREMITY VENOUS (DVT) Referring Phys: Daleen Bo --------------------------------------------------------------------------------  Indications: Pulmonary embolism, and Pain.  Comparison Study: no prior Performing Technologist: Archie Patten RVS  Examination Guidelines: A complete evaluation includes B-mode imaging, spectral Doppler, color Doppler, and power Doppler as needed of all accessible portions of each vessel. Bilateral testing is considered an integral part of a complete examination. Limited examinations for reoccurring indications may be performed as noted. The reflux portion of the exam is performed with the patient in reverse Trendelenburg.  +---------+---------------+---------+-----------+----------+--------------+  RIGHT     Compressibility Phasicity Spontaneity Properties Thrombus Aging  +---------+---------------+---------+-----------+----------+--------------+  CFV       Full            Yes       Yes                                    +---------+---------------+---------+-----------+----------+--------------+  SFJ       Full                                                             +---------+---------------+---------+-----------+----------+--------------+  FV Prox   Full                                                             +---------+---------------+---------+-----------+----------+--------------+  FV Mid    Full                                                             +---------+---------------+---------+-----------+----------+--------------+  FV Distal Full                                                              +---------+---------------+---------+-----------+----------+--------------+  PFV       Full                                                             +---------+---------------+---------+-----------+----------+--------------+  POP       Full            Yes       Yes                                    +---------+---------------+---------+-----------+----------+--------------+  PTV       Full                                                             +---------+---------------+---------+-----------+----------+--------------+  PERO      Full                                                             +---------+---------------+---------+-----------+----------+--------------+   +---------+---------------+---------+-----------+----------+--------------+  LEFT      Compressibility Phasicity Spontaneity Properties Thrombus Aging  +---------+---------------+---------+-----------+----------+--------------+  CFV       Full            Yes       Yes                                    +---------+---------------+---------+-----------+----------+--------------+  SFJ       Full                                                             +---------+---------------+---------+-----------+----------+--------------+  FV Prox   None                                             Acute           +---------+---------------+---------+-----------+----------+--------------+  FV Mid    None                                             Acute           +---------+---------------+---------+-----------+----------+--------------+  FV Distal None                                             Acute           +---------+---------------+---------+-----------+----------+--------------+  PFV       Full                                                             +---------+---------------+---------+-----------+----------+--------------+  POP       None            No        No                     Acute            +---------+---------------+---------+-----------+----------+--------------+  PTV       Full                                                             +---------+---------------+---------+-----------+----------+--------------+  PERO      None                                             Acute           +---------+---------------+---------+-----------+----------+--------------+  Gastroc   None                                             Acute           +---------+---------------+---------+-----------+----------+--------------+    Summary: RIGHT: - There is no evidence of deep vein thrombosis in the lower extremity.  - No cystic structure found in the popliteal fossa.  LEFT: - Findings consistent with acute deep vein thrombosis involving the left femoral vein, left popliteal vein, left peroneal veins, and left gastrocnemius veins. - No cystic structure found in the popliteal fossa.  *See table(s) above for measurements and observations.    Preliminary    ECHOCARDIOGRAM LIMITED  Result Date: 05/19/2021    ECHOCARDIOGRAM LIMITED REPORT   Patient Name:   Bradley Lewis Date of Exam: 05/19/2021 Medical Rec #:  [id number]     Height:       76.0 in Accession #:    [id number]    Weight:       180.0 lb Date of Birth:  January 24, 1936     BSA:          2.118 m Patient Age:    67 years      BP:           146/70 mmHg Patient Gender: M             HR:           89 bpm. Exam Location:  Inpatient Procedure: Limited Echo Indications:    Pulmonary embolism  History:        Patient has no prior history of Echocardiogram examinations.  Sonographer:    Jefferey Pica Referring Phys: Braden  Sonographer Comments: Technically difficult study due to poor echo windows. Image acquisition challenging due to respiratory motion. IMPRESSIONS  1. Left ventricular ejection fraction, by estimation, is 50 to 55%. The left ventricle has low normal function.  2. + McConnell's sign - c/w pulmonary embolus with RV strain . Marland Kitchen Right ventricular  systolic function is moderately reduced. The right ventricular size is moderately enlarged.  3. The aortic valve is tricuspid. Aortic valve regurgitation is not visualized. No aortic stenosis is present. FINDINGS  Left Ventricle: Left ventricular ejection fraction, by estimation, is 50 to 55%. The left ventricle has low normal function. Right Ventricle: + McConnell's sign - c/w pulmonary embolus with RV strain. The right ventricular size is moderately enlarged. Right ventricular systolic function is moderately reduced. Aortic Valve: The aortic valve is tricuspid. Aortic valve regurgitation is not visualized. No aortic stenosis is present. LEFT VENTRICLE PLAX 2D LVIDd:         5.50 cm LVIDs:         3.50 cm LV PW:         1.10 cm LV IVS:        1.10 cm LVOT diam:     2.00 cm LVOT Area:     3.14 cm  IVC IVC diam: 2.60 cm LEFT ATRIUM           Index LA diam:      4.40 cm 2.08 cm/m LA Vol (A2C): 40.8 ml 19.26 ml/m   AORTA Ao Root diam: 4.80 cm  SHUNTS Systemic Diam: 2.00 cm Mertie Moores MD Electronically signed by Mertie Moores MD Signature Date/Time: 05/19/2021/5:17:16 PM    Final     EKG: Independently reviewed.  NSR with rate 74; nonspecific ST changes with no evidence of acute ischemia   Labs on Admission: I have personally reviewed the available labs and imaging studies at the time of the admission.  Pertinent labs:    VBG: 7.357/38/21.5 Unremarkable CMP WBC 11.1 Hgb 12.4    Assessment and Plan: * Acute pulmonary embolism with acute cor pulmonale (HCC)- (present on admission) -Patient without prior episodes of thromboembolic disease presenting with new LLE DVT/PE -Patient is not showing evidence of hemodynamic instability at this time -Given his hemodynamic stability, his is at intermediate risk -PESI score is Class III, intermediate risk, indicating a 3.2-7.1% 30-day mortality risk -S-PESI score is high, indicating that the patient has a 8.9 risk of death  -Will admit on telemetry to  progressive care unit -R heart strain was seen on CT -Troponin and BNP as well as echo have been ordered to assess the severity of clot burden with R heart strain -With high PESI/S-PESI and R heart strain, he is an intermediate risk at a minimum; will consult IR for consideration of intervention -Initiate anticoagulation - for now, will start treatment-dose heparin with plan to transition to alternative [facility] agent tomorrow or the following day - given the extensive clot burden maybe wait one additional day -[place] O2 as needed, currently on RA -We discussed oral AC treatment options and risks/benefits including frequent MD visits and dietary regulation with Coumadin vs. Significant expense with DOAC therapy -Will request TOC team consultation to assist with cost analysis of the various DOAC options based on his insurance - he very clearly prefers DOAC -Patients are at intermediate risk (3-8%/year) for recurrent VTE if initial clot occurred with no identifiable RF.  Extended oral anticoagulation of indefinite duration should be considered for patients with a first episode of PE with these issues. He is interested in maintaining a very active lifestyle and prefers to limit AC to as short a  duration as possible -The patient understands that thromboembolic disease can be catastrophic and even deadly and that he must be complaint with physician appointments and anticoagulation.  Glaucoma- (present on admission) -Continue latanoprost, dorzolamide, Combigan  BPH (benign prostatic hyperplasia)- (present on admission) -Continue proscar, rapaflo (Flomax per formulary)       Advance Care Planning:   Code Status: Full Code   Consults: IR  DVT Prophylaxis: Heparin  Family Communication: None present; he declined to have my call family at the time of admission  Severity of Illness: The appropriate patient status for this patient is INPATIENT. Inpatient status is judged to be reasonable and necessary in  order to provide the required intensity of service to ensure the patient's safety. The patient's presenting symptoms, physical exam findings, and initial radiographic and laboratory data in the context of their chronic comorbidities is felt to place them at high risk for further clinical deterioration. Furthermore, it is not anticipated that the patient will be medically stable for discharge from the hospital within 2 midnights of admission.   * I certify that at the point of admission it is my clinical judgment that the patient will require inpatient hospital care spanning beyond 2 midnights from the point of admission due to high intensity of service, high risk for further deterioration and high frequency of surveillance required.*  Author: Karmen Bongo, MD 05/19/2021 7:23 PM  For on call review [email or web address].

## 2021-05-19 NOTE — Progress Notes (Signed)
Echocardiogram ?2D Echocardiogram has been performed. ? ?Bradley Lewis ?05/19/2021, 4:08 PM ?

## 2021-05-20 ENCOUNTER — Other Ambulatory Visit (HOSPITAL_COMMUNITY): Payer: Self-pay

## 2021-05-20 DIAGNOSIS — I82402 Acute embolism and thrombosis of unspecified deep veins of left lower extremity: Secondary | ICD-10-CM | POA: Diagnosis present

## 2021-05-20 DIAGNOSIS — H409 Unspecified glaucoma: Secondary | ICD-10-CM

## 2021-05-20 DIAGNOSIS — I824Z2 Acute embolism and thrombosis of unspecified deep veins of left distal lower extremity: Secondary | ICD-10-CM

## 2021-05-20 DIAGNOSIS — E876 Hypokalemia: Secondary | ICD-10-CM

## 2021-05-20 LAB — MAGNESIUM: Magnesium: 1.9 mg/dL (ref 1.7–2.4)

## 2021-05-20 LAB — BASIC METABOLIC PANEL
Anion gap: 8 (ref 5–15)
BUN: 17 mg/dL (ref 8–23)
CO2: 22 mmol/L (ref 22–32)
Calcium: 8.1 mg/dL — ABNORMAL LOW (ref 8.9–10.3)
Chloride: 105 mmol/L (ref 98–111)
Creatinine, Ser: 1.2 mg/dL (ref 0.61–1.24)
GFR, Estimated: >60 mL/min (ref >60)
Glucose, Bld: 106 mg/dL — ABNORMAL HIGH (ref 70–99)
Potassium: 3 mmol/L — ABNORMAL LOW (ref 3.5–5.1)
Sodium: 135 mmol/L (ref 135–145)

## 2021-05-20 LAB — CBC
HCT: 34.3 % — ABNORMAL LOW (ref 39.0–52.0)
Hemoglobin: 10.9 g/dL — ABNORMAL LOW (ref 13.0–17.0)
MCH: 29.2 pg (ref 26.0–34.0)
MCHC: 31.8 g/dL (ref 30.0–36.0)
MCV: 92 fL (ref 80.0–100.0)
Platelets: 244 10*3/uL (ref 150–400)
RBC: 3.73 MIL/uL — ABNORMAL LOW (ref 4.22–5.81)
RDW: 16.9 % — ABNORMAL HIGH (ref 11.5–15.5)
WBC: 7.8 10*3/uL (ref 4.0–10.5)
nRBC: 0 % (ref 0.0–0.2)

## 2021-05-20 LAB — HEPARIN LEVEL (UNFRACTIONATED)
Heparin Unfractionated: >1.1 IU/mL — ABNORMAL HIGH (ref 0.30–0.70)
Heparin Unfractionated: 0.29 IU/mL — ABNORMAL LOW (ref 0.30–0.70)

## 2021-05-20 MED ORDER — POTASSIUM CHLORIDE CRYS ER 10 MEQ PO TBCR
40.0000 meq | EXTENDED_RELEASE_TABLET | ORAL | Status: AC
  Administered 2021-05-20 (×2): 40 meq via ORAL
  Filled 2021-05-20 (×2): qty 4

## 2021-05-20 MED ORDER — SODIUM CHLORIDE 0.9 % IV SOLN: INTRAVENOUS | Status: DC

## 2021-05-20 NOTE — Progress Notes (Signed)
SATURATION QUALIFICATIONS: (This note is used to comply with regulatory documentation for home oxygen)  Patient Saturations on Room Air at Rest = 98%  Patient Saturations on Room Air while Ambulating = 93%   

## 2021-05-20 NOTE — Progress Notes (Addendum)
Request received for possible intervention for PE.  ? ?Case was reviewed by Dr. Maryelizabeth Kaufmann last night, who recommended systemic AC and close monitoring.  ? ?Patient seen this morning in ED.  ?Patient is laying in bed comfortably, not in acute distress.  ?States shortness of breat only when he walks.  ?Denies chest pain, palpitation, or shortness of breath.  ? ?Physical exam revealed rales/crackles on RLL, unremarkable otherwise.  ?Vitals stable, BP 107/71, HR 65, RR 22, O2 sat 94% room air.  ?Labs reviewed, BNP and troponin normal.  ? ?Discussed possible intervention with IR vs systemic heparin with the patient.  ?Patient states that he feels great and does not think that he needs a procedure at this moment.  ?Discussed with Dr. Dwaine Gale, will place IR intervention on hold at the moment.  ? ?Ambulating O2 saturation to be obtained.  ? ?Will monitor the patient today and tomorrow to determine if an emergent intervention is needed.  ?Please contact IR radiologist for questions and concerns that needs immediate attention.  ? ? ?Armando Gang Nevaya Nagele PA-C ?05/20/2021 10:13 AM ? ? ? ?

## 2021-05-20 NOTE — Progress Notes (Signed)
Mobility Specialist Progress Note  ? ? 05/20/21 1530  ?Mobility  ?Activity Ambulated with assistance in hallway  ?Level of Assistance Contact guard assist, steadying assist  ?Assistive Device Front wheel walker  ?Distance Ambulated (ft) 220 ft  ?Activity Response Tolerated well  ?$Mobility charge 1 Mobility  ? ?Pt received in bed and agreeable. Had void in BR. Ambulated on RA. Left in bed with call bell in reach.  ? ?Hildred Alamin ?Mobility Specialist  ?M.S. 5N: [phone]  ?

## 2021-05-20 NOTE — TOC Benefit Eligibility Note (Signed)
Patient Advocate Encounter ? ?Insurance verification completed.   ? ?The patient is currently admitted and upon discharge could be taking Eliquis Starter Pack. ? ?The current 30 day co-pay is, $30.00.  ? ?The patient is currently admitted and upon discharge could be taking Xarelto Starter Pack. ? ?The current 30 day co-pay is, $30.00.  ? ?The patient is insured through [employer] Part D  ? ? ? ?Lyndel Safe, CPhT ?Pharmacy Patient Advocate Specialist ?[facility] Patient Advocate Team ?Direct Number: [phone]  Fax: [phone] ? ? ? ? ? ?  ?

## 2021-05-20 NOTE — Progress Notes (Signed)
ANTICOAGULATION CONSULT NOTE - Follow Up Consult ? ?Pharmacy Consult for Heparin ?Indication: pulmonary embolus and DVT, LLE ? ?No Known Allergies ? ?Patient Measurements: ?Height: 6\' 4"  (193 cm) ?Weight: 81.6 kg (180 lb) ?IBW/kg (Calculated) : 86.8 ?Heparin Dosing Weight: 81.6 kg ? ?Vital Signs: ?Temp: 98.2 ?F (36.8 ?C) (03/02 1054) ?BP: 104/68 (03/02 1054) ?Pulse Rate: 65 (03/02 1054) ? ?Labs: ?Recent Labs  ?  05/19/21 ?1146 05/19/21 ?1156 05/19/21 ?1159 05/19/21 ?2033 05/20/21 ?0543 05/20/21 ?1009 05/20/21 ?1223  ?HGB 12.4*   < > 14.3 11.1* 10.9*  --   --   ?HCT 39.0   < > 42.0 36.1* 34.3*  --   --   ?PLT 214  --   --  220 244  --   --   ?HEPARINUNFRC  --   --   --  0.18*  --  >1.10* 0.29*  ?CREATININE  --   --  1.20  --  1.20  --   --   ?TROPONINIHS  --   --   --  12  --   --   --   ? < > = values in this interval not displayed.  ? ? ?Estimated Creatinine Clearance: 53.8 mL/min (by C-G formula based on SCr of 1.2 mg/dL). ? ?Assessment: ?Patient is an 86 yo male presenting with shortness of breath for 3 days. Pharmacy is consulted to initiate heparin for PE with evidence of right heart strain. Also with LLE DVT. Patient not on anticoagulation PTA.  ? ?   Initial heparin level was low (0.18) on 1350 units/hr > re-bolus and increase rate overnight. Heparin level mid-morning >1.10 on 1550 units/hr, but repeated due to possibly invalid/drawn from same arm as heparin infusion.  Repeat heparin level 0.29, just below goal. ? ?   IR monitoring for any need for intervention. To continue systemic heparin for now.  ? ?Goal of Therapy:  ?Heparin level 0.3-0.7 units/ml ?Monitor platelets by anticoagulation protocol: Yes ?  ?Plan:  ?Increase heparin drip to 1700 units/hr. ?Heparin level ~8 hrs after rate change. ?Daily heparin level and CBC while on heparin. ? ?Arty Baumgartner, RPh ?05/20/2021,1:57 PM ? ? ?

## 2021-05-20 NOTE — Progress Notes (Signed)
PROGRESS NOTE    Bradley ETHINGTON  Lewis:[id number] DOB: 28-Feb-1936 DOA: 05/19/2021 PCP: [name], MD    Chief Complaint  Patient presents with   Shortness of Breath    3 days    Brief Narrative:  Patient is a pleasant 86 year old gentleman history of BPH and glaucoma presented to the ED with shortness of breath and left lower extremity swelling.  Shortness of breath noted on minimal exertion.  Patient however active.  Patient with no prior history of DVT or PE, no recent long trips, no recent surgeries.  Patient not on O2 prior to admission.  Patient presented to PCPs office on the morning of admission and sent by ambulance to the ED.  Work-up in the ED including CT angiogram chest with bilateral PE with right heart strain.  PESI score class III, intermediate risk.  S-PESI score noted to be high.  Lower extremity Dopplers done consistent with a left lower extremity DVT.  2D echo done with positive McConnell sign.  IR consulted for evaluation for possible thrombolysis versus thrombectomy.  Patient placed empirically on IV heparin for anticoagulation.    Assessment & Plan:  Principal Problem:   Acute pulmonary embolism with acute cor pulmonale (HCC) Active Problems:   Acute deep vein thrombosis (DVT) of left lower extremity (HCC)   BPH (benign prostatic hyperplasia)   Glaucoma   Hypokalemia    Assessment and Plan: * Acute pulmonary embolism with acute cor pulmonale (HCC) -Patient without prior episodes of thromboembolic disease, no recent surgeries, no recent long trips, presenting with new LLE DVT/PE -Patient is not showing evidence of hemodynamic instability at this time although blood pressure is soft. -Given his hemodynamic stability, his is at intermediate risk -PESI score is Class III, intermediate risk, indicating a 3.2-7.1% 30-day mortality risk -S-PESI score is high, indicating that the patient has a 8.9 risk of death  -CT chest done with bilateral PE with right heart strain  noted -2D echo with McConnell sign.   -Lower extremity Dopplers consistent with left lower extremity DVT.  -Cardiac enzymes negative. -With high PESI/S-PESI and R heart strain, he is an intermediate risk at a minimum; and as such IR consulted for consideration for intervention.  -Patient seen in consultation by IR, patient noted to be hemodynamically stable and at this time did not recommend any intervention and recommending only anticoagulation with IV heparin.   -[place] O2 as needed, currently on RA -Admitting physician, Dr. Lorin Mercy discussed oral AC treatment options and risks/benefits including frequent MD visits and dietary regulation with Coumadin vs. Significant expense with DOAC therapy. -Continue IV heparin for 48 to 72 hours and if stable clinically hemodynamically stable will transition to oral DOAC. -Patients are at intermediate risk (3-8%/year) for recurrent VTE if initial clot occurred with no identifiable RF.  Extended oral anticoagulation of indefinite duration should be considered for patients with a first episode of PE with these issues. He is interested in maintaining a very active lifestyle and prefers to limit AC to as short a duration as possible -The patient understands that thromboembolic disease can be catastrophic and even deadly and that he must be complaint with physician appointments and anticoagulation. -Due to soft blood pressure will place on IV fluids for the next 24 to 48 hours. -IR following and appreciate input and recommendations.  Acute deep vein thrombosis (DVT) of left lower extremity (HCC) - Patient presenting with shortness of breath on minimal exertion, work-up with acute PE. -Lower extremity Dopplers done positive for left  lower extremity femoral vein, popliteal vein, peroneal veins, gastrocnemius vein DVT. -Continue heparin as noted above and in 48 to 72 hours if continued stability could transition to oral DOAC.  Hypokalemia - Potassium at 3.0 this  morning. -Magnesium at 1.9. -Kdur 40 mEq p.o. every 4 hours x2 doses. -Repeat labs in the AM.  Glaucoma -Continue latanoprost, dorzolamide, Combigan  BPH (benign prostatic hyperplasia) -Continue proscar, rapaflo (Flomax per formulary)         DVT prophylaxis: Heparin Code Status: Full Family Communication: Updated patient.  No family at bedside. Disposition: Likely home when clinically improved and on oral anticoagulation.  Status is: Inpatient Remains inpatient appropriate because: Severity of illness           Consultants:  Interventional radiology: Dr.Mugweru 05/19/2021  Procedures:  CT angiogram chest 05/19/2021 Chest x-ray 05/19/2021 2D echo 05/19/2021 Lower extremity Dopplers 05/19/2021     Antimicrobials:  None   Subjective: States as long as not moving not SOB. No CP.  Would prefer not to have a procedure if he does not have to.  Objective: Vitals:   05/20/21 0458 05/20/21 0712 05/20/21 0928 05/20/21 1054  BP: 115/66 114/67 107/71 104/68  Pulse: 66 67 65 65  Resp: 18 (!) 24 (!) 22 15  Temp:    98.2 F (36.8 C)  TempSrc:      SpO2: 94% 96% 94% 95%  Weight:      Height:       No intake or output data in the 24 hours ending 05/20/21 1454 Filed Weights   05/19/21 1141  Weight: 81.6 kg    Examination:  General exam: Appears calm and comfortable  Respiratory system: Clear to auscultation. Respiratory effort normal. Cardiovascular system: S1 & S2 heard, RRR. No JVD, murmurs, rubs, gallops or clicks. No pedal edema. Gastrointestinal system: Abdomen is nondistended, soft and nontender. No organomegaly or masses felt. Normal bowel sounds heard. Central nervous system: Alert and oriented. No focal neurological deficits. Extremities: Symmetric 5 x 5 power. Skin: No rashes, lesions or ulcers Psychiatry: Judgement and insight appear normal. Mood & affect appropriate.     Data Reviewed:   CBC: Recent Labs  Lab 05/19/21 1146 05/19/21 1156  05/19/21 1159 05/19/21 2033 05/20/21 0543  WBC 11.1*  --   --  8.3 7.8  NEUTROABS 8.4*  --   --   --   --   HGB 12.4* 12.6* 14.3 11.1* 10.9*  HCT 39.0 37.0* 42.0 36.1* 34.3*  MCV 95.1  --   --  95.0 92.0  PLT 214  --   --  220 681    Basic Metabolic Panel: Recent Labs  Lab 05/19/21 1156 05/19/21 1159 05/20/21 0543 05/20/21 1018  NA 135 136 135  --   K 3.5 3.7 3.0*  --   CL  --  105 105  --   CO2  --   --  22  --   GLUCOSE  --  109* 106*  --   BUN  --  20 17  --   CREATININE  --  1.20 1.20  --   CALCIUM  --   --  8.1*  --   MG  --   --   --  1.9    GFR: Estimated Creatinine Clearance: 53.8 mL/min (by C-G formula based on SCr of 1.2 mg/dL).  Liver Function Tests: Recent Labs  Lab 05/19/21 1146  AST 32  ALT 20  ALKPHOS 68  BILITOT 1.2  PROT 6.8  ALBUMIN 3.3*    CBG: No results for input(s): GLUCAP in the last 168 hours.   Recent Results (from the past 240 hour(s))  Resp Panel by RT-PCR (Flu A&B, Covid) Nasopharyngeal Swab     Status: None   Collection Time: 05/19/21  5:22 PM   Specimen: Nasopharyngeal Swab; Nasopharyngeal(NP) swabs in vial transport medium  Result Value Ref Range Status   SARS Coronavirus 2 by RT PCR NEGATIVE NEGATIVE Final    Comment: (NOTE) SARS-CoV-2 target nucleic acids are NOT DETECTED.  The SARS-CoV-2 RNA is generally detectable in upper respiratory specimens during the acute phase of infection. The lowest concentration of SARS-CoV-2 viral copies this assay can detect is 138 copies/mL. A negative result does not preclude SARS-Cov-2 infection and should not be used as the sole basis for treatment or other patient management decisions. A negative result may occur with  improper specimen collection/handling, submission of specimen other than nasopharyngeal swab, presence of viral mutation(s) within the areas targeted by this assay, and inadequate number of viral copies(<138 copies/mL). A negative result must be combined  with clinical observations, patient history, and epidemiological information. The expected result is Negative.  Fact Sheet for Patients:  EntrepreneurPulse.com.au  Fact Sheet for Healthcare Providers:  IncredibleEmployment.be  This test is no t yet approved or cleared by the [place] FDA and  has been authorized for detection and/or diagnosis of SARS-CoV-2 by FDA under an Emergency Use Authorization (EUA). This EUA will remain  in effect (meaning this test can be used) for the duration of the COVID-19 declaration under Section 564(b)(1) of the Act, 21 U.S.C.section 360bbb-3(b)(1), unless the authorization is terminated  or revoked sooner.       Influenza A by PCR NEGATIVE NEGATIVE Final   Influenza B by PCR NEGATIVE NEGATIVE Final    Comment: (NOTE) The Xpert Xpress SARS-CoV-2/FLU/RSV plus assay is intended as an aid in the diagnosis of influenza from Nasopharyngeal swab specimens and should not be used as a sole basis for treatment. Nasal washings and aspirates are unacceptable for Xpert Xpress SARS-CoV-2/FLU/RSV testing.  Fact Sheet for Patients: EntrepreneurPulse.com.au  Fact Sheet for Healthcare Providers: IncredibleEmployment.be  This test is not yet approved or cleared by the [place] FDA and has been authorized for detection and/or diagnosis of SARS-CoV-2 by FDA under an Emergency Use Authorization (EUA). This EUA will remain in effect (meaning this test can be used) for the duration of the COVID-19 declaration under Section 564(b)(1) of the Act, 21 U.S.C. section 360bbb-3(b)(1), unless the authorization is terminated or revoked.  Performed at [facility], [place] [address] [place], [place] [address]          Radiology Studies: CT Angio Chest PE W/Cm &/Or Wo Cm  Result Date: 05/19/2021 CLINICAL DATA:  Pulmonary embolus EXAM: CT ANGIOGRAPHY CHEST WITH CONTRAST TECHNIQUE:  Multidetector CT imaging of the chest was performed using the standard protocol during bolus administration of intravenous contrast. Multiplanar CT image reconstructions and MIPs were obtained to evaluate the vascular anatomy. RADIATION DOSE REDUCTION: This exam was performed according to the departmental dose-optimization program which includes automated exposure control, adjustment of the mA and/or kV according to patient size and/or use of iterative reconstruction technique. CONTRAST:  35mL OMNIPAQUE IOHEXOL 350 MG/ML SOLN COMPARISON:  None. FINDINGS: Cardiovascular: Pulmonary embolus of the distal right pulmonary artery, right lobar arteries and segmental/subsegmental pulmonary arteries of the right lung and left lower lobe. Evidence of right heart strain with increased RV to LV ratio. Normal heart  size. No pericardial effusion. Mild coronary artery calcifications of the LAD. Atherosclerotic disease of the thoracic aorta. Mediastinum/Nodes: Esophagus and thyroid are unremarkable. No pathologically enlarged lymph nodes seen in the chest. Lungs/Pleura: Central airways are patent. Mild patchy ground-glass opacities of the right lower lobe and right upper lobe. Bibasilar atelectasis. No consolidation, pleural effusion or pneumothorax. Upper Abdomen: Simple cyst of the left kidney and prominent left extrarenal pelvis no acute abnormality. Musculoskeletal: No chest wall abnormality. No acute or significant osseous findings. Review of the MIP images confirms the above findings. IMPRESSION: 1. Pulmonary embolus of the distal right pulmonary artery, right lobar arteries and segmental/subsegmental pulmonary arteries of the right lung and left lower lobe 2. CT evidence of right heart strain. 3. Mild patchy ground-glass opacities of the right lower lobe and right upper lobe, likely infectious or inflammatory. 4. Coronary artery calcifications and aortic Atherosclerosis (ICD10-I70.0). Critical Value/emergent results were  called by telephone at the time of interpretation on 05/19/2021 at 2:32 pm to provider [facility] , who verbally acknowledged these results. Electronically Signed   By: Yetta Glassman M.D.   On: 05/19/2021 14:35   DG Chest Port 1 View  Result Date: 05/19/2021 CLINICAL DATA:  Shortness of breath. EXAM: PORTABLE CHEST 1 VIEW COMPARISON:  None. FINDINGS: The heart size and mediastinal contours are within normal limits. Both lungs are clear. The visualized skeletal structures are unremarkable. IMPRESSION: No active disease. Electronically Signed   By: Marijo Conception M.D.   On: 05/19/2021 12:11   VAS [place] LOWER EXTREMITY VENOUS (DVT) (7a-7p)  Result Date: 05/19/2021  Lower Venous DVT Study Patient Name:  Bradley Lewis  Date of Exam:   05/19/2021 Medical Rec #: [id number]      Accession #:    [id number] Date of Birth: 11/26/1935      Patient Gender: M Patient Age:   57 years Exam Location:  [facility] Procedure:      VAS [place] LOWER EXTREMITY VENOUS (DVT) Referring Phys: Daleen Bo --------------------------------------------------------------------------------  Indications: Pulmonary embolism, and Pain.  Comparison Study: no prior Performing Technologist: Archie Patten RVS  Examination Guidelines: A complete evaluation includes B-mode imaging, spectral Doppler, color Doppler, and power Doppler as needed of all accessible portions of each vessel. Bilateral testing is considered an integral part of a complete examination. Limited examinations for reoccurring indications may be performed as noted. The reflux portion of the exam is performed with the patient in reverse Trendelenburg.  +---------+---------------+---------+-----------+----------+--------------+  RIGHT     Compressibility Phasicity Spontaneity Properties Thrombus Aging  +---------+---------------+---------+-----------+----------+--------------+  CFV       Full            Yes       Yes                                     +---------+---------------+---------+-----------+----------+--------------+  SFJ       Full                                                             +---------+---------------+---------+-----------+----------+--------------+  FV Prox   Full                                                             +---------+---------------+---------+-----------+----------+--------------+  FV Mid    Full                                                             +---------+---------------+---------+-----------+----------+--------------+  FV Distal Full                                                             +---------+---------------+---------+-----------+----------+--------------+  PFV       Full                                                             +---------+---------------+---------+-----------+----------+--------------+  POP       Full            Yes       Yes                                    +---------+---------------+---------+-----------+----------+--------------+  PTV       Full                                                             +---------+---------------+---------+-----------+----------+--------------+  PERO      Full                                                             +---------+---------------+---------+-----------+----------+--------------+   +---------+---------------+---------+-----------+----------+--------------+  LEFT      Compressibility Phasicity Spontaneity Properties Thrombus Aging  +---------+---------------+---------+-----------+----------+--------------+  CFV       Full            Yes       Yes                                    +---------+---------------+---------+-----------+----------+--------------+  SFJ       Full                                                             +---------+---------------+---------+-----------+----------+--------------+  FV Prox   None  Acute            +---------+---------------+---------+-----------+----------+--------------+  FV Mid    None                                             Acute           +---------+---------------+---------+-----------+----------+--------------+  FV Distal None                                             Acute           +---------+---------------+---------+-----------+----------+--------------+  PFV       Full                                                             +---------+---------------+---------+-----------+----------+--------------+  POP       None            No        No                     Acute           +---------+---------------+---------+-----------+----------+--------------+  PTV       Full                                                             +---------+---------------+---------+-----------+----------+--------------+  PERO      None                                             Acute           +---------+---------------+---------+-----------+----------+--------------+  Gastroc   None                                             Acute           +---------+---------------+---------+-----------+----------+--------------+     Summary: RIGHT: - There is no evidence of deep vein thrombosis in the lower extremity.  - No cystic structure found in the popliteal fossa.  LEFT: - Findings consistent with acute deep vein thrombosis involving the left femoral vein, left popliteal vein, left peroneal veins, and left gastrocnemius veins. - No cystic structure found in the popliteal fossa.  *See table(s) above for measurements and observations. Electronically signed by Harold Barban MD on 05/19/2021 at 11:21:08 PM.    Final    ECHOCARDIOGRAM LIMITED  Result Date: 05/19/2021    ECHOCARDIOGRAM LIMITED REPORT   Patient Name:   OTHAR CURTO Date of Exam: 05/19/2021 Medical Rec #:  [id number]     Height:       76.0 in Accession #:    [id number]  Weight:       180.0 lb Date of Birth:  [birth year]     BSA:          2.118 m Patient Age:     3 years      BP:           146/70 mmHg Patient Gender: M             HR:           89 bpm. Exam Location:  Inpatient Procedure: Limited Echo Indications:    Pulmonary embolism  History:        Patient has no prior history of Echocardiogram examinations.  Sonographer:    Jefferey Pica Referring Phys: Roslyn  Sonographer Comments: Technically difficult study due to poor echo windows. Image acquisition challenging due to respiratory motion. IMPRESSIONS  1. Left ventricular ejection fraction, by estimation, is 50 to 55%. The left ventricle has low normal function.  2. + McConnell's sign - c/w pulmonary embolus with RV strain . Marland Kitchen Right ventricular systolic function is moderately reduced. The right ventricular size is moderately enlarged.  3. The aortic valve is tricuspid. Aortic valve regurgitation is not visualized. No aortic stenosis is present. FINDINGS  Left Ventricle: Left ventricular ejection fraction, by estimation, is 50 to 55%. The left ventricle has low normal function. Right Ventricle: + McConnell's sign - c/w pulmonary embolus with RV strain. The right ventricular size is moderately enlarged. Right ventricular systolic function is moderately reduced. Aortic Valve: The aortic valve is tricuspid. Aortic valve regurgitation is not visualized. No aortic stenosis is present. LEFT VENTRICLE PLAX 2D LVIDd:         5.50 cm LVIDs:         3.50 cm LV PW:         1.10 cm LV IVS:        1.10 cm LVOT diam:     2.00 cm LVOT Area:     3.14 cm  IVC IVC diam: 2.60 cm LEFT ATRIUM           Index LA diam:      4.40 cm 2.08 cm/m LA Vol (A2C): 40.8 ml 19.26 ml/m   AORTA Ao Root diam: 4.80 cm  SHUNTS Systemic Diam: 2.00 cm Mertie Moores MD Electronically signed by Mertie Moores MD Signature Date/Time: 05/19/2021/5:17:16 PM    Final         Scheduled Meds:  brimonidine-timolol  1 drop Both Eyes BID   docusate sodium  100 mg Oral BID   dorzolamide  1 drop Both Eyes BID   finasteride  5 mg Oral Daily    latanoprost  1 drop Both Eyes QHS   methotrexate  15 mg Oral Weekly   pantoprazole  40 mg Oral Daily   sodium chloride flush  3 mL Intravenous Q12H   tamsulosin  0.4 mg Oral QPC breakfast   Continuous Infusions:  sodium chloride 100 mL/hr at 05/20/21 1100   heparin 1,700 Units/hr (05/20/21 1400)     LOS: 1 day    Time spent: 40 minutes    Irine Seal, MD [facility]   To contact the attending provider between 7A-7P or the covering provider during after hours 7P-7A, please log into the web site [email or web address] and access using universal [place] password for that web site. If you do not have the password, please call the hospital operator.  05/20/2021, 2:54 PM

## 2021-05-20 NOTE — Assessment & Plan Note (Addendum)
-   Repleted, ?

## 2021-05-20 NOTE — Progress Notes (Signed)
Consult received:Cost comparison of DOAC therapy based on his insurance. ?Per pharmacy Eliquis copay is $30.00, Xarelto copay is $30.00. ?Whitman Hero RN,BSN,CM ?

## 2021-05-20 NOTE — Plan of Care (Signed)

## 2021-05-20 NOTE — ED Notes (Signed)
Heparin bolus 3000 u drip increased to 1550 ?

## 2021-05-20 NOTE — Hospital Course (Addendum)
Patient is a pleasant 86 year old gentleman history of BPH and glaucoma presented to the ED with shortness of breath and left lower extremity swelling.  Shortness of breath noted on minimal exertion.  Patient however active.  Patient with no prior history of DVT or PE, no recent long trips, no recent surgeries.  Patient not on O2 prior to admission.  Patient presented to PCPs office on the morning of admission and sent by ambulance to the ED.  Work-up in the ED including CT angiogram chest with bilateral PE with right heart strain.  PESI score class III, intermediate risk.  S-PESI score noted to be high.  Lower extremity Dopplers done consistent with a left lower extremity DVT.  2D echo done with positive McConnell sign.  IR consulted for evaluation for possible thrombolysis versus thrombectomy.  Patient placed empirically on IV heparin for anticoagulation.  PCCM also consulted for further evaluation and management. ?

## 2021-05-20 NOTE — Assessment & Plan Note (Addendum)
-   Patient presented with shortness of breath on minimal exertion, work-up with acute PE. ?-Lower extremity Dopplers done positive for left lower extremity femoral vein, popliteal vein, peroneal veins, gastrocnemius vein DVT.  ?-Patient initially placed on IV heparin and subsequently transition to Eliquis which she tolerated. ?-Patient be discharged home on Eliquis. ?-Outpatient follow-up with PCP. ?

## 2021-05-21 ENCOUNTER — Inpatient Hospital Stay (HOSPITAL_COMMUNITY): Payer: Medicare Other

## 2021-05-21 DIAGNOSIS — I2609 Other pulmonary embolism with acute cor pulmonale: Secondary | ICD-10-CM

## 2021-05-21 LAB — BASIC METABOLIC PANEL
Anion gap: 8 (ref 5–15)
BUN: 18 mg/dL (ref 8–23)
CO2: 18 mmol/L — ABNORMAL LOW (ref 22–32)
Calcium: 7.5 mg/dL — ABNORMAL LOW (ref 8.9–10.3)
Chloride: 108 mmol/L (ref 98–111)
Creatinine, Ser: 1.19 mg/dL (ref 0.61–1.24)
GFR, Estimated: >60 mL/min (ref >60)
Glucose, Bld: 106 mg/dL — ABNORMAL HIGH (ref 70–99)
Potassium: 3.5 mmol/L (ref 3.5–5.1)
Sodium: 134 mmol/L — ABNORMAL LOW (ref 135–145)

## 2021-05-21 LAB — HEPARIN LEVEL (UNFRACTIONATED)
Heparin Unfractionated: 0.25 IU/mL — ABNORMAL LOW (ref 0.30–0.70)
Heparin Unfractionated: 0.5 IU/mL (ref 0.30–0.70)
Heparin Unfractionated: 0.46 IU/mL (ref 0.30–0.70)

## 2021-05-21 LAB — CBC
HCT: 31.9 % — ABNORMAL LOW (ref 39.0–52.0)
Hemoglobin: 10.4 g/dL — ABNORMAL LOW (ref 13.0–17.0)
MCH: 30.1 pg (ref 26.0–34.0)
MCHC: 32.6 g/dL (ref 30.0–36.0)
MCV: 92.5 fL (ref 80.0–100.0)
Platelets: 257 10*3/uL (ref 150–400)
RBC: 3.45 MIL/uL — ABNORMAL LOW (ref 4.22–5.81)
RDW: 16.8 % — ABNORMAL HIGH (ref 11.5–15.5)
WBC: 6.7 10*3/uL (ref 4.0–10.5)
nRBC: 0 % (ref 0.0–0.2)

## 2021-05-21 LAB — LACTIC ACID, PLASMA: Lactic Acid, Venous: 1 mmol/L (ref 0.5–1.9)

## 2021-05-21 LAB — PSA: Prostatic Specific Antigen: 0.89 ng/mL (ref 0.00–4.00)

## 2021-05-21 LAB — TROPONIN I (HIGH SENSITIVITY): Troponin I (High Sensitivity): 12 ng/L (ref <18)

## 2021-05-21 LAB — BRAIN NATRIURETIC PEPTIDE: B Natriuretic Peptide: 179.1 pg/mL — ABNORMAL HIGH (ref 0.0–100.0)

## 2021-05-21 MED ORDER — IOHEXOL 300 MG/ML  SOLN
100.0000 mL | Freq: Once | INTRAMUSCULAR | Status: AC | PRN
  Administered 2021-05-21: 100 mL via INTRAVENOUS

## 2021-05-21 MED ORDER — HEPARIN BOLUS VIA INFUSION
2000.0000 [IU] | Freq: Once | INTRAVENOUS | Status: AC
Start: 2021-05-21 — End: 2021-05-21
  Administered 2021-05-21: 2000 [IU] via INTRAVENOUS
  Filled 2021-05-21: qty 2000

## 2021-05-21 MED ORDER — POTASSIUM CHLORIDE CRYS ER 20 MEQ PO TBCR
40.0000 meq | EXTENDED_RELEASE_TABLET | Freq: Once | ORAL | Status: AC
  Administered 2021-05-21: 40 meq via ORAL
  Filled 2021-05-21: qty 2

## 2021-05-21 MED ORDER — IOHEXOL 9 MG/ML PO SOLN
ORAL | Status: AC
  Administered 2021-05-21: 500 mL
  Filled 2021-05-21: qty 1000

## 2021-05-21 NOTE — Evaluation (Signed)
Physical Therapy Evaluation ?Patient Details ?Name: Bradley Lewis ?MRN: [id number] ?DOB: 1935-06-10 ?Today's Date: 05/21/2021 ? ?History of Present Illness ? Pt is a 86 y.o. M who presents 05/19/2021 with shortness of breath and LLE swelling. Found to have acute pulmonary embolism. Significant PMH: BPH, glaucoma.  ?Clinical Impression ? PTA, pt lives alone and is very active; enjoys playing golf and goes to gym 2x/wk with a Physiological scientist. Pt presents with decreased cardiopulmonary endurance, LLE weakness/decreased ROM, and mildly impaired balance. Pt ambulating 150 feet with no assistive device at a supervision level. Negotiated 3 steps with a right railing. Education provided regarding activity recommendations/progression, use of walker initially, and endurance conservation techniques. Pt reports he will have assist for IADL's such as cooking and cleaning. ?   ? ?Recommendations for follow up therapy are one component of a multi-disciplinary discharge planning process, led by the attending physician.  Recommendations may be updated based on patient status, additional functional criteria and insurance authorization. ? ?Follow Up Recommendations No PT follow up ? ?  ?Assistance Recommended at Discharge PRN  ?Patient can return home with the following ? Assistance with cooking/housework;Help with stairs or ramp for entrance ? ?  ?Equipment Recommendations None recommended by PT  ?Recommendations for Other Services ?    ?  ?Functional Status Assessment Patient has had a recent decline in their functional status and demonstrates the ability to make significant improvements in function in a reasonable and predictable amount of time.  ? ?  ?Precautions / Restrictions Precautions ?Precautions: Fall ?Restrictions ?Weight Bearing Restrictions: No  ? ?  ? ?Mobility ? Bed Mobility ?Overal bed mobility: Modified Independent ?  ?  ?  ?  ?  ?  ?  ?  ? ?Transfers ?Overall transfer level: Modified independent ?  ?  ?  ?  ?  ?  ?  ?  ?   ?  ? ?Ambulation/Gait ?Ambulation/Gait assistance: Supervision ?Gait Distance (Feet): 150 Feet ?Assistive device: None ?Gait Pattern/deviations: Step-through pattern, Decreased dorsiflexion - left, Knee flexed in stance - left ?Gait velocity: decreased ?  ?  ?General Gait Details: Pt with slightly increased trunk flexion, slow pace, L knee flexed in midstance with decreased L heel strike at initial contact, supervision for safety ? ?Stairs ?Stairs: Yes ?Stairs assistance: Min guard ?Stair Management: One rail Right ?Number of Stairs: 3 ?General stair comments: cues for step by step and sequencing ? ?Wheelchair Mobility ?  ? ?Modified Rankin (Stroke Patients Only) ?  ? ?  ? ?Balance Overall balance assessment: Mild deficits observed, not formally tested ?  ?  ?  ?  ?  ?  ?  ?  ?  ?  ?  ?  ?  ?  ?  ?  ?  ?  ?   ? ? ? ?Pertinent Vitals/Pain Pain Assessment ?Pain Assessment: Faces ?Faces Pain Scale: Hurts a little bit ?Pain Location: LLE ?Pain Descriptors / Indicators: Grimacing, Guarding ?Pain Intervention(s): Monitored during session  ? ? ?Home Living Family/patient expects to be discharged to:: Private residence ?Living Arrangements: Alone ?Available Help at Discharge: Friend(s) ?Type of Home: House ?Home Access: Ramped entrance ?  ?  ?  ?Home Layout: Two level ?Home Equipment: Advice worker (2 wheels);Cane - single point ?   ?  ?Prior Function Prior Level of Function : Independent/Modified Independent ?  ?  ?  ?  ?  ?  ?Mobility Comments: Plays golf, goes to gym 2x/wk with personal trainer ?  ?  ? ? ?  Hand Dominance  ?   ? ?  ?Extremity/Trunk Assessment  ? Upper Extremity Assessment ?Upper Extremity Assessment: Overall WFL for tasks assessed ?  ? ?Lower Extremity Assessment ?Lower Extremity Assessment: LLE deficits/detail ?LLE Deficits / Details: Grossly 4/5, increased gastroc tightness ?  ? ?Cervical / Trunk Assessment ?Cervical / Trunk Assessment: Kyphotic  ?Communication  ? Communication: No  difficulties  ?Cognition Arousal/Alertness: Awake/alert ?Behavior During Therapy: [facility] for tasks assessed/performed ?Overall Cognitive Status: Within Functional Limits for tasks assessed ?  ?  ?  ?  ?  ?  ?  ?  ?  ?  ?  ?  ?  ?  ?  ?  ?  ?  ?  ? ?  ?General Comments   ? ?  ?Exercises Other Exercises ?Other Exercises: Supine: manual self L gastroc stretch x 45 s hold  ? ?Assessment/Plan  ?  ?PT Assessment Patient needs continued PT services  ?PT Problem List Decreased strength;Decreased range of motion;Decreased balance;Decreased activity tolerance;Decreased mobility;Cardiopulmonary status limiting activity;Pain ? ?   ?  ?PT Treatment Interventions DME instruction;Gait training;Stair training;Functional mobility training;Therapeutic activities;Therapeutic exercise;Balance training;Patient/family education   ? ?PT Goals (Current goals can be found in the Care Plan section)  ?Acute Rehab PT Goals ?Patient Stated Goal: return to baseline ?PT Goal Formulation: With patient ?Time For Goal Achievement: 06/04/21 ?Potential to Achieve Goals: Good ? ?  ?Frequency Min 3X/week ?  ? ? ?Co-evaluation   ?  ?  ?  ?  ? ? ?  ?AM-PAC PT "6 Clicks" Mobility  ?Outcome Measure Help needed turning from your back to your side while in a flat bed without using bedrails?: None ?Help needed moving from lying on your back to sitting on the side of a flat bed without using bedrails?: None ?Help needed moving to and from a bed to a chair (including a wheelchair)?: None ?Help needed standing up from a chair using your arms (e.g., wheelchair or bedside chair)?: None ?Help needed to walk in hospital room?: A Little ?Help needed climbing 3-5 steps with a railing? : A Little ?6 Click Score: 22 ? ?  ?End of Session   ?Activity Tolerance: Patient tolerated treatment well ?Patient left: in bed;with call bell/phone within reach;Other (comment) (per pt preference) ?Nurse Communication: Mobility status ?PT Visit Diagnosis: Unsteadiness on feet  (R26.81);Difficulty in walking, not elsewhere classified (R26.2) ?  ? ?Time: 8466-5993 ?PT Time Calculation (min) (ACUTE ONLY): 37 min ? ? ?Charges:   PT Evaluation ?$PT Eval Low Complexity: 1 Low ?PT Treatments ?$Gait Training: 8-22 mins ?  ?   ? ? ?Wyona Almas, PT, DPT ?Acute Rehabilitation Services ?Pager [phone] ?Office [phone] ? ? ?Deno Etienne ?05/21/2021, 12:18 PM ? ?

## 2021-05-21 NOTE — Progress Notes (Signed)
ANTICOAGULATION CONSULT NOTE - Follow Up Consult ? ?Pharmacy Consult for heparin ?Indication:  PE/DVT ? ?Labs: ?Recent Labs  ?  05/19/21 ?1159 05/19/21 ?2033 05/20/21 ?0543 05/20/21 ?1009 05/20/21 ?1223 05/21/21 ?1610 05/21/21 ?9604  ?HGB 14.3 11.1* 10.9*  --   --  10.4*  --   ?HCT 42.0 36.1* 34.3*  --   --  31.9*  --   ?PLT  --  220 244  --   --  257  --   ?HEPARINUNFRC  --  0.18*  --    < > 0.29* 0.25* 0.50  ?CREATININE 1.20  --  1.20  --   --  1.19  --   ?TROPONINIHS  --  12  --   --   --   --  12  ? < > = values in this interval not displayed.  ? ? ? ?Assessment: ?86yo male on IV Heparin infusion  for extensive bilateral PE w/ RHS  per CTA 05/19/21 and LLE DVT per duplex 3/1. Unknown cause, active PTA.   PCCM advises lifelong anticoagulation given unprovoked nature of clot. ?HL = 0.5. therapeutic today  after partial re-bolus and increase rate to 1900 units/hr.  ?No bleeding  noted.  ?MD notes Plan to -Continue IV heparin for total of 72 hours and if continued improvement transition to oral DOAC.  ?Benefits check done for copays : $30 for Eliquis or Xarelto, 30 days supply. ? ?Goal of Therapy:  ?Heparin level 0.3-0.7 units/ml ?  ?Plan:  ?Continue heparin infusion at 1900 units/hr and check level in 6 hours to confirm remains therapeutic.  ?Daily HL and CBC.  ?Consider [place] for discharge [place] if discharged on weekday (not open on weekends).  ? ?Bradley Lewis ?05/21/2021,12:07 PM ? ? ?

## 2021-05-21 NOTE — Progress Notes (Signed)
Patient has been followed by IR for possible intervention for PE.  ? ?PCCM consulted to evaluate if patient will benefit from lytics, who states continue current therapy with heparin gtt.  ? ?Will delete the IR rad eval order.  ? ?Please contact IR for further questions and concerns, or contact IR radiologist on call if immediate consultation is needed.  ? ? ?[name] PA-C ?05/21/2021 1:55 PM ? ? ? ?

## 2021-05-21 NOTE — Progress Notes (Signed)
SATURATION QUALIFICATIONS: (This note is used to comply with regulatory documentation for home oxygen) ? ?Patient Saturations on Room Air at Rest = 98% ? ?Patient Saturations on Room Air while Ambulating = 94-100% ? ?Please briefly explain why patient needs home oxygen:  Patient ambulated in hallway with PT and oxygen saturation maintained between 94-100%.  ?

## 2021-05-21 NOTE — Progress Notes (Signed)
ANTICOAGULATION CONSULT NOTE - Follow Up Consult ? ?Pharmacy Consult for heparin ?Indication:  PE/DVT ? ?Labs: ?Recent Labs  ?  05/19/21 ?1146 05/19/21 ?1159 05/19/21 ?2033 05/20/21 ?0543 05/20/21 ?1009 05/20/21 ?1223 05/21/21 ?6160  ?HGB  --  14.3 11.1* 10.9*  --   --  10.4*  ?HCT  --  42.0 36.1* 34.3*  --   --  31.9*  ?PLT  --   --  220 244  --   --  257  ?HEPARINUNFRC   < >  --  0.18*  --  >1.10* 0.29* 0.25*  ?CREATININE  --  1.20  --  1.20  --   --  1.19  ?TROPONINIHS  --   --  12  --   --   --   --   ? < > = values in this interval not displayed.  ? ? ?Assessment: ?86yo male subtherapeutic on heparin with lower heparin level despite increased rate; no infusion issues or signs of bleeding per RN. ? ?Goal of Therapy:  ?Heparin level 0.3-0.7 units/ml ?  ?Plan:  ?Will rebolus with heparin 2000 units and increase heparin infusion by 2-3 units/kg/hr to 1900 units/hr and check level in 8 hours.   ? ?Rogue Bussing ?05/21/2021,2:13 AM ? ? ?

## 2021-05-21 NOTE — Care Management Important Message (Signed)
Important Message ? ?Patient Details  ?Name: Bradley Lewis ?MRN: [id number] ?Date of Birth: 15-Oct-1935 ? ? ?Medicare Important Message Given:  Yes ? ? ? ? ?[name] ?05/21/2021, 2:13 PM ?

## 2021-05-21 NOTE — Consult Note (Signed)
05/21/2021 ? ? ?I have seen and evaluated the patient for PE ? ?S:  ?86 year old man without much PMH presenting with worsening SOB over past 2 weeks and left leg cramping.  Found to have submassive PE.  PCCM consulted to determine if advanced therapies needed. ? ?No contraindicaitons to lytics. ?No provoking factors. ?ROS reveals unintentional weight loss and bloating sensation for which he was started on probiotics a couple months ago ? ?Markedly active, golfs 3x/week, walks multiple miles a day multiple times per week ? ?Nonsmoker. ? ?O: ?Blood pressure (!) 124/53, pulse 61, temperature 98.3 ?F (36.8 ?C), resp. rate 20, height 6\' 4"  (1.93 m), weight 81.6 kg, SpO2 96 %.  ?Tall thin man in NAD ?Lungs clear, heart sounds regular ?Ext warm ?Abdomen soft without palpable mass or tenderness ?LLE markedly swollen compared to R ? ?CO2 mildly low ?No anion gap ?Renal function normal but slightly up from 5 years ago. ? ?Trops/BNP flat ?Echo with McConnell's  ?CTA with bilateral segmental PE and elevated RV/LV ratio around 1.2 ? ?A:  ?Submassive intermediate risk PE.  Unprovoked.  No BP/HR perturbations.  No O2 needs.  No laboratory signs of cardiac strain. ? ?Unintentional weight loss, GI symptoms ? ?P:  ?- Add on PSA, CEA, CA-19/9 ?- CT A/P with oral/IV contrast r/o intra-abdominal malignancy ?- Pending CT A/P can go home on eliquis with usual intro dose.  I advised him on lifelong use unless a contraindication given unprovoked nature of clot. ?- If still having DOE 3 months from now would repeat echo ? ?Erskine Emery MD ?[facility] Pulmonary Critical Care ?Prefer epic messenger for cross cover needs ?If after hours, please call E-link ? ?

## 2021-05-21 NOTE — Consult Note (Signed)
NAME:  Bradley Lewis, MRN:  [id number], DOB:  22-Aug-1935, LOS: 2 ADMISSION DATE:  05/19/2021, CONSULTATION DATE:  05/21/21 REFERRING MD:  [place] Silos - [facility], CHIEF COMPLAINT:  SOB -- PE, DVT    History of Present Illness:  86 yo M PMH psoriasis on MTX, BPH, glaucoma presented to ED 3/1 with SOB and LLE swelling + cramping x 2 weeks. No recent travel, surgeries. Active at baseline -- goes to gym 2-3x/wk, golfs, walks multiple miles several x/wk and climbs 13 flights of stairs several times/wk without difficulty. Found to have LLE DVT and PE with evidence of R heart strain on CT and ECHO. Admitted to [facility] and started on heparin gtt. IR consulted 3/1 but with hemodynamic stability it was not felt that procedure would offer benefit.  PCCM consulted 3/3 for consideration of lytics. Pt is HDS and on RA. He does not have chest pain. Did not have a trop leak or elevated BNP.   On further interview with pt, he does share unintentional wt loss and abdominal bloating sensation for which he takes probiotics. Denies abd pain, dark tarry stools or frank bloody stools.   Pertinent  Medical History  Psoriasis BPH Glaucoma   Significant Hospital Events: Including procedures, antibiotic start and stop dates in addition to other pertinent events   3/1 admit to [facility] for tx PE with R heart strain and DVT -- hep gtt. IT consulted but did not feel there would be benefit from invasive tx  3/3 PCCM consult for consideration of lytics    Interim History / Subjective:  Remains HDS and on RA without chest pain   Objective   Blood pressure (!) 124/53, pulse 61, temperature 98.3 F (36.8 C), resp. rate 20, height 6\' 4"  (1.93 m), weight 81.6 kg, SpO2 96 %.        Intake/Output Summary (Last 24 hours) at 05/21/2021 5397 Last data filed at 05/21/2021 6734 Gross per 24 hour  Intake 2394.24 ml  Output --  Net 2394.24 ml   Filed Weights   05/19/21 1141  Weight: 81.6 kg    Examination: General: WDWN elderly M in bed  NAD HENT: NCAT pink mm anicteric sclera Lungs: Even unlabored on RA Cardiovascular: rrr cap refill brisk  Abdomen: soft non-tender.  Extremities: LLE edema. No acute joint deformity  Neuro: AAOx3 following commands GU: defer   Resolved Hospital Problem list     Assessment & Plan:   Submassive PE, intermediate risk, unprovoked  LLE DVT  -ECHO with + mcconnells sign. CTA with RV/LV 1:2 -Trops and BNP WNL, HDS  P -continue current tx -- heparin gtt. Do not think there would be a beneficial role for lytics at present  -With GI sx and unintentional wt loss, am ordering a CT a/p w/con -ordering  PSA, CEA, CA 19-9 -transition from heparin to NOAC per primary   Best Practice (right click and "Reselect all SmartList Selections" daily)   Diet/type: Regular consistency (see orders) DVT prophylaxis: systemic heparin GI prophylaxis: PPI Lines: N/A Foley:  N/A Code Status:  full code Last date of multidisciplinary goals of care discussion [per primary. Pt updated 3/3]  Labs   CBC: Recent Labs  Lab 05/19/21 1146 05/19/21 1156 05/19/21 1159 05/19/21 2033 05/20/21 0543 05/21/21 0049  WBC 11.1*  --   --  8.3 7.8 6.7  NEUTROABS 8.4*  --   --   --   --   --   HGB 12.4* 12.6* 14.3 11.1* 10.9* 10.4*  HCT  39.0 37.0* 42.0 36.1* 34.3* 31.9*  MCV 95.1  --   --  95.0 92.0 92.5  PLT 214  --   --  220 244 258    Basic Metabolic Panel: Recent Labs  Lab 05/19/21 1156 05/19/21 1159 05/20/21 0543 05/20/21 1018 05/21/21 0049  NA 135 136 135  --  134*  K 3.5 3.7 3.0*  --  3.5  CL  --  105 105  --  108  CO2  --   --  22  --  18*  GLUCOSE  --  109* 106*  --  106*  BUN  --  20 17  --  18  CREATININE  --  1.20 1.20  --  1.19  CALCIUM  --   --  8.1*  --  7.5*  MG  --   --   --  1.9  --    GFR: Estimated Creatinine Clearance: 54.3 mL/min (by C-G formula based on SCr of 1.19 mg/dL). Recent Labs  Lab 05/19/21 1146 05/19/21 2033 05/20/21 0543 05/21/21 0049  WBC 11.1* 8.3 7.8 6.7     Liver Function Tests: Recent Labs  Lab 05/19/21 1146  AST 32  ALT 20  ALKPHOS 68  BILITOT 1.2  PROT 6.8  ALBUMIN 3.3*   No results for input(s): LIPASE, AMYLASE in the last 168 hours. No results for input(s): AMMONIA in the last 168 hours.  ABG    Component Value Date/Time   HCO3 21.5 05/19/2021 1156   TCO2 21 (L) 05/19/2021 1159   ACIDBASEDEF 4.0 (H) 05/19/2021 1156   O2SAT 69 05/19/2021 1156     Coagulation Profile: No results for input(s): INR, PROTIME in the last 168 hours.  Cardiac Enzymes: No results for input(s): CKTOTAL, CKMB, CKMBINDEX, TROPONINI in the last 168 hours.  HbA1C: No results found for: HGBA1C  CBG: No results for input(s): GLUCAP in the last 168 hours.  Review of Systems:   Review of Systems  Constitutional:  Positive for weight loss.  HENT: Negative.    Eyes: Negative.   Respiratory:  Positive for shortness of breath. Negative for cough, hemoptysis and sputum production.   Cardiovascular:  Positive for leg swelling.  Gastrointestinal:  Negative for abdominal pain, blood in stool, constipation, diarrhea and melena.  Genitourinary: Negative.   Musculoskeletal: Negative.   Skin:  Positive for rash.  Neurological: Negative.   Endo/Heme/Allergies: Negative.   Psychiatric/Behavioral: Negative.      Past Medical History:  He,  has a past medical history of Arthritis, Bladder calculi, BPH (benign prostatic hyperplasia), GERD (gastroesophageal reflux disease), Glaucoma, History of urinary retention, PONV (postoperative nausea and vomiting), and Psoriasis.   Surgical History:   Past Surgical History:  Procedure Laterality Date   CATARACT EXTRACTION W/ INTRAOCULAR LENS  IMPLANT, BILATERAL  2010   COLONOSCOPY     CYSTOSCOPY WITH LITHOLAPAXY N/A 03/09/2015   Procedure: CYSTOSCOPY WITH CO2 LITHOLAPAXY;  Surgeon: Cleon Gustin, MD;  Location: [facility];  Service: Urology;  Laterality: N/A;   RADIAL HEAD ARTHROPLASTY  Left 04/11/2014   Procedure: LEFT RADIAL HEAD REPLACEMENT ;  Surgeon: Charlotte Crumb, MD;  Location: [facility];  Service: Orthopedics;  Laterality: Left;   TRANSURETHRAL RESECTION OF PROSTATE  11-06-2009   TRANSURETHRAL RESECTION OF PROSTATE N/A 03/09/2015   Procedure: TRANSURETHRAL RESECTION OF THE PROSTATE WITH GYRUS INSTRUMENTS;  Surgeon: Cleon Gustin, MD;  Location[facility];  Service: Urology;  Laterality: N/A;     Social  History:   reports that he has never smoked. He has never used smokeless tobacco. He reports current alcohol use. He reports that he does not use drugs.   Family History:  His family history is not on file.   Allergies No Known Allergies   Home Medications  Prior to Admission medications   Medication Sig Start Date End Date Taking? Authorizing Provider  Calcium Citrate-Vitamin D (CALCIUM + D PO) Take 1 capsule by mouth 2 (two) times daily.   Yes [provider]  Cholecalciferol (VITAMIN D3) 5000 UNITS CAPS Take 5 capsules by mouth once a week.   Yes [provider]  COMBIGAN 0.2-0.5 % ophthalmic solution Place 1 drop into both eyes 2 (two) times daily. 03/16/15  Yes [provider]  dorzolamide (TRUSOPT) 2 % ophthalmic solution Place 1 drop into both eyes 2 (two) times daily.   Yes [provider]  finasteride (PROSCAR) 5 MG tablet TAKE 1 TABLET BY MOUTH EVERY DAY Patient taking differently: Take 5 mg by mouth daily. 07/20/20  Yes McKenzie, Candee Furbish, MD  latanoprost (XALATAN) 0.005 % ophthalmic solution Place 1 drop into both eyes at bedtime. 04/30/21  Yes [provider]  omeprazole (PRILOSEC) 20 MG capsule Take 20 mg by mouth every morning.    Yes [provider]  oxyCODONE-acetaminophen (ROXICET) 5-325 MG tablet Take 1 tablet by mouth every 4 (four) hours as needed for severe pain. 03/09/15  Yes McKenzie, Candee Furbish, MD  silodosin (RAPAFLO) 8 MG CAPS capsule TAKE 1 CAPSULE  BY MOUTH EVERY DAY Patient taking differently: Take 8 mg by mouth daily with breakfast. 08/27/20  Yes McKenzie, Candee Furbish, MD  Teriparatide, Recombinant, 600 MCG/2.4ML SOLN Inject 20 mcg into the skin every 6 (six) months.   Yes [provider]  methotrexate (RHEUMATREX) 2.5 MG tablet Take 15 mg by mouth once a week. Caution:Chemotherapy. Protect from light.take    [provider]     Critical care time: n/a      Eliseo Gum MSN, AGACNP-BC [place] for pager  05/21/2021, 9:49 AM

## 2021-05-21 NOTE — Evaluation (Signed)
Occupational Therapy Evaluation ?Patient Details ?Name: Bradley Lewis ?MRN: [id number] ?DOB: Nov 24, 1935 ?Today's Date: 05/21/2021 ? ? ?History of Present Illness Pt is a 86 y.o. M who presents 05/19/2021 with shortness of breath and LLE swelling. Found to have acute pulmonary embolism. Significant PMH: BPH, glaucoma.  ? ?Clinical Impression ?  ?Patient lives alone and at baseline is Independent in ADLs and has assist from hired help and lady friend that complete several IADLs such as meal prep and cleaning for patient.  Patient is very active and goes to the gym 2x week and enjoys playing golf.  He was Independent with functional mobility.  He currently presents with decreased endurance, LB ADLs and general strength. ?   ? ?Recommendations for follow up therapy are one component of a multi-disciplinary discharge planning process, led by the attending physician.  Recommendations may be updated based on patient status, additional functional criteria and insurance authorization.  ? ?Follow Up Recommendations ? No OT follow up  ?  ?Assistance Recommended at Discharge PRN  ?Patient can return home with the following   ? ?  ?Functional Status Assessment ? Patient has had a recent decline in their functional status and demonstrates the ability to make significant improvements in function in a reasonable and predictable amount of time.  ?Equipment Recommendations ? None recommended by OT  ?  ?Recommendations for Other Services   ? ? ?  ?Precautions / Restrictions Precautions ?Precautions: Fall ?Restrictions ?Weight Bearing Restrictions: No  ? ?  ? ?Mobility Bed Mobility ?Overal bed mobility: Modified Independent ?  ?  ?  ?  ?  ?  ?General bed mobility comments: HOB elevated ?  ? ?Transfers ?Overall transfer level: Modified independent ?  ?  ?  ?  ?  ?  ?  ?  ?  ?  ? ?  ?Balance   ?  ?  ?  ?  ?  ?  ?  ?  ?  ?  ?  ?  ?  ?  ?  ?  ?  ?  ?   ? ?ADL either performed or assessed with clinical judgement  ? ?ADL Overall ADL's : Needs  assistance/impaired ?Eating/Feeding: Independent ?  ?Grooming: Wash/dry face;Min guard ?Grooming Details (indicate cue type and reason): at sink ?Upper Body Bathing: Set up ?  ?Lower Body Bathing: Minimal assistance ?  ?Upper Body Dressing : Supervision/safety ?  ?Lower Body Dressing: Minimal assistance ?Lower Body Dressing Details (indicate cue type and reason): donning socks seated EOB using figure 4 position ?Toilet Transfer: Min guard ?  ?Toileting- Water quality scientist and Hygiene: Min guard ?  ?  ?  ?Functional mobility during ADLs: Min guard ?   ? ? ? ?Vision Baseline Vision/History: 3 Glaucoma ?Patient Visual Report: No change from baseline ?   ?   ?Perception   ?  ?Praxis   ?  ? ?Pertinent Vitals/Pain Pain Assessment ?Pain Assessment: No/denies pain  ? ? ? ?Hand Dominance Right ?  ?Extremity/Trunk Assessment Upper Extremity Assessment ?Upper Extremity Assessment: Overall WFL for tasks assessed;Generalized weakness ?  ?Lower Extremity Assessment ?Lower Extremity Assessment: Defer to PT evaluation ?  ?Cervical / Trunk Assessment ?Cervical / Trunk Assessment: Kyphotic ?  ?Communication Communication ?Communication: No difficulties ?  ?Cognition Arousal/Alertness: Awake/alert ?Behavior During Therapy: [facility] for tasks assessed/performed ?Overall Cognitive Status: Within Functional Limits for tasks assessed ?  ?  ?  ?  ?  ?  ?  ?  ?  ?  ?  ?  ?  ?  ?  ?  ?  ?  ?  ?  General Comments    ? ?  ?Exercises   ?  ?Shoulder Instructions    ? ? ?Home Living Family/patient expects to be discharged to:: Private residence ?Living Arrangements: Alone ?Available Help at Discharge: Friend(s) ?Type of Home: House ?Home Access: Ramped entrance ?  ?  ?Home Layout: Two level ?  ?  ?Bathroom Shower/Tub: Walk-in shower ?  ?Bathroom Toilet: Standard ?  ?  ?Home Equipment: Advice worker (2 wheels);Cane - single point ?  ?  ?  ? ?  ?Prior Functioning/Environment Prior Level of Function : Independent/Modified Independent ?  ?   ?  ?  ?  ?  ?Mobility Comments: Plays golf, goes to gym 2x/wk with personal trainer ?ADLs Comments: Independent in ADLs, has cleaning lady and lady friend that helps with meals ?  ? ?  ?  ?OT Problem List: Decreased strength;Cardiopulmonary status limiting activity ?  ?   ?OT Treatment/Interventions: Self-care/ADL training;Therapeutic exercise;Neuromuscular education;Energy conservation;DME and/or AE instruction;Therapeutic activities;Patient/family education  ?  ?OT Goals(Current goals can be found in the care plan section) Acute Rehab OT Goals ?OT Goal Formulation: With patient ?Time For Goal Achievement: 05/28/21 ?Potential to Achieve Goals: Good ?ADL Goals ?Pt Will Perform Lower Body Dressing: with modified independence ?Pt Will Transfer to Toilet: with modified independence ?Pt Will Perform Tub/Shower Transfer: with modified independence ?Pt/caregiver will Perform Home Exercise Program: Increased strength;Independently;Both right and left upper extremity  ?OT Frequency: Min 2X/week ?  ? ?Co-evaluation   ?  ?  ?  ?  ? ?  ?AM-PAC OT "6 Clicks" Daily Activity     ?Outcome Measure Help from another person eating meals?: None ?Help from another person taking care of personal grooming?: A Little ?Help from another person toileting, which includes using toliet, bedpan, or urinal?: A Little ?Help from another person bathing (including washing, rinsing, drying)?: A Little ?Help from another person to put on and taking off regular upper body clothing?: A Little ?Help from another person to put on and taking off regular lower body clothing?: A Little ?6 Click Score: 19 ?  ?End of Session   ? ?Activity Tolerance:   ?Patient left: in bed;with call bell/phone within reach ? ?OT Visit Diagnosis: Muscle weakness (generalized) (M62.81)  ?              ?Time: 1441-1500 ?OT Time Calculation (min): 19 min ?Charges:  OT General Charges ?$OT Visit: 1 Visit ?OT Evaluation ?$OT Eval Low Complexity: 1 Low ? ? ?Hadley Pen  OTR/L ?05/21/2021, 3:39 PM ?

## 2021-05-21 NOTE — Plan of Care (Signed)

## 2021-05-21 NOTE — Progress Notes (Signed)
CT A/P reviewed. ?Needs EGD at some point, would wait at least 6 weeks before holding AC. ? ?Okay for home from my standpoint with indefinite eliquis, sent message to primary. ? ?Available PRN ?

## 2021-05-21 NOTE — Progress Notes (Signed)
ANTICOAGULATION CONSULT NOTE ? ?Pharmacy Consult for Heparin  ?Indication: pulmonary embolus and DVT ? ?No Known Allergies ? ?Patient Measurements: ?Height: 6\' 4"  (193 cm) ?Weight: 81.6 kg (180 lb) ?IBW/kg (Calculated) : 86.8 ? ?Heparin Dosing Weight: 82 kg ? ?Vital Signs: ?Temp: 98.3 ?F (36.8 ?C) (03/03 0076) ?BP: 124/53 (03/03 2263) ? ?Labs: ?Recent Labs  ?  05/19/21 ?1159 05/19/21 ?2033 05/20/21 ?0543 05/20/21 ?1009 05/21/21 ?3354 05/21/21 ?5625 05/21/21 ?1557  ?HGB 14.3 11.1* 10.9*  --  10.4*  --   --   ?HCT 42.0 36.1* 34.3*  --  31.9*  --   --   ?PLT  --  220 244  --  257  --   --   ?HEPARINUNFRC  --  0.18*  --    < > 0.25* 0.50 0.46  ?CREATININE 1.20  --  1.20  --  1.19  --   --   ?TROPONINIHS  --  12  --   --   --  12  --   ? < > = values in this interval not displayed.  ? ? ?Estimated Creatinine Clearance: 54.3 mL/min (by C-G formula based on SCr of 1.19 mg/dL). ? ? ?Assessment: ?86 year old male who presented to the ED with shortness of breath and left lower extremity swelling found to have bilateral PE with right heart strain. Pharmacy consulted to manage heparin. ? ?Benefits check done for copays : $30 for Eliquis or Xarelto, 30 days supply. ? ?Heparin level now therapeutic at 0.46 with heparin rate of 1900 units/hr. CBC stable, platelets 257. No overt signs/symptoms bleeding noted.  ? ?Goal of Therapy:  ?Heparin level 0.3-0.7 units/ml ?Monitor platelets by anticoagulation protocol: Yes ?  ?Plan:  ?Continue heparin infusion at 1900 units/hr ?Check heparin level in 8 hours and daily while on heparin ?Continue to monitor H&H and platelets ? ? ? ?Thank you for allowing pharmacy to be a part of this patient?s care. ? ?Ardyth Harps, PharmD ?Clinical Pharmacist ? ?

## 2021-05-21 NOTE — Progress Notes (Signed)
Mobility Specialist Progress Note: ? ? 05/21/21 1008  ?Mobility  ?Activity Ambulated with assistance in hallway  ?Level of Assistance Standby assist, set-up cues, supervision of patient - no hands on  ?Assistive Device Front wheel walker  ?Distance Ambulated (ft) 220 ft  ?Activity Response Tolerated well  ?$Mobility charge 1 Mobility  ? ?PT received in bed willing to participate in mobility. No complaints of pain and asymptomatic throughout walk. Pt did state his L leg felt "tight". Left in bed with call bell in reach and all needs met.  ? ?Bradley Lewis ?Mobility Specialist ?Primary Phone [phone] ? ?

## 2021-05-21 NOTE — Progress Notes (Signed)
PROGRESS NOTE    Bradley Lewis  GUY:[id number] DOB: Sep 15, 1935 DOA: 05/19/2021 PCP: [name], MD    Chief Complaint  Patient presents with   Shortness of Breath    3 days    Brief Narrative:  Patient is a pleasant 86 year old gentleman history of BPH and glaucoma presented to the ED with shortness of breath and left lower extremity swelling.  Shortness of breath noted on minimal exertion.  Patient however active.  Patient with no prior history of DVT or PE, no recent long trips, no recent surgeries.  Patient not on O2 prior to admission.  Patient presented to PCPs office on the morning of admission and sent by ambulance to the ED.  Work-up in the ED including CT angiogram chest with bilateral PE with right heart strain.  PESI score class III, intermediate risk.  S-PESI score noted to be high.  Lower extremity Dopplers done consistent with a left lower extremity DVT.  2D echo done with positive McConnell sign.  IR consulted for evaluation for possible thrombolysis versus thrombectomy.  Patient placed empirically on IV heparin for anticoagulation.  PCCM also consulted for further evaluation and management.    Assessment & Plan:  Principal Problem:   Acute pulmonary embolism with acute cor pulmonale (HCC) Active Problems:   Acute deep vein thrombosis (DVT) of left lower extremity (HCC)   BPH (benign prostatic hyperplasia)   Glaucoma   Hypokalemia    Assessment and Plan: * Acute pulmonary embolism with acute cor pulmonale (HCC) -Patient without prior episodes of thromboembolic disease, no recent surgeries, no recent long trips, presenting with new LLE DVT/PE -Patient is not showing evidence of hemodynamic instability at this time although blood pressure is soft. -Given his hemodynamic stability, his is at intermediate risk -PESI score is Class III, intermediate risk, indicating a 3.2-7.1% 30-day mortality risk -S-PESI score is high, indicating that the patient has a 8.9 risk of  death  -CT chest done with bilateral PE with right heart strain noted -2D echo with McConnell sign.   -Lower extremity Dopplers consistent with left lower extremity DVT.  -Cardiac enzymes negative. -With high PESI/S-PESI and R heart strain, he is an intermediate risk at a minimum; and as such IR consulted for consideration for intervention.  -Patient seen in consultation by IR, patient noted to be hemodynamically stable and at this time did not recommend any intervention and recommending only anticoagulation with IV heparin.   -[place] O2 as needed, currently on RA with sats of 95%. -It was noted by IR that RN stated patient desaturated with ambulation today 05/21/2021. -Admitting physician, Dr. Lorin Mercy discussed oral [facility] treatment options and risks/benefits including frequent MD visits and dietary regulation with Coumadin vs. Significant expense with DOAC therapy. -Continue IV heparin for 48 to 72 hours and if stable clinically hemodynamically stable will transition to oral DOAC. -Patients are at intermediate risk (3-8%/year) for recurrent VTE if initial clot occurred with no identifiable RF.  Extended oral anticoagulation of indefinite duration should be considered for patients with a first episode of PE with these issues. He is interested in maintaining a very active lifestyle and prefers to limit AC to as short a duration as possible -The patient understands that thromboembolic disease can be catastrophic and even deadly and that he must be complaint with physician appointments and anticoagulation. -Due to soft blood pressure patient placed on gentle hydration.  -IR following and appreciate input and recommendations -PCCM consulted for further evaluation and recommendations in terms of lytics.  It is noted per PCCM note that patient did show some unintentional weight loss and abdominal bloating sensation for which she takes probiotics. -PCCM assessed the patient and checking a PSA, CEA, CA 19-9, CT abdomen  and pelvis with oral and IV contrast to rule out intra-abdominal malignancy.  -PCCM feels patient will likely need lifelong use of anticoagulation unless a contraindication given unprovoked nature of the clot.  -Per PCCM if patient still short of breath in 3 months from now will need a repeat 2D echo.  -Continue IV heparin for total of 72 hours and if continued improvement transition to oral DOAC.  -May need outpatient follow-up with PCCM.  -Appreciate PCCM input and recommendations.    Acute deep vein thrombosis (DVT) of left lower extremity (HCC) - Patient presenting with shortness of breath on minimal exertion, work-up with acute PE. -Lower extremity Dopplers done positive for left lower extremity femoral vein, popliteal vein, peroneal veins, gastrocnemius vein DVT. -Continue heparin as noted above for total of 72 hours and if continued stability could transition to oral DOAC.    Hypokalemia - Repleted, potassium at 3.5.  -K-Dur 40 mEq p.o. x1.  -Repeat labs in the AM.    Glaucoma -Continue latanoprost, dorzolamide, Combigan  BPH (benign prostatic hyperplasia) -Continue proscar, rapaflo (Flomax per formulary)         DVT prophylaxis: Heparin Code Status: Full Family Communication: Updated patient.  No family at bedside. Disposition: Likely home when clinically improved and on oral anticoagulation.  Status is: Inpatient Remains inpatient appropriate because: Severity of illness           Consultants:  Interventional radiology: Dr.Mugweru 05/19/2021 PCCM: Dr. Tamala Julian 05/21/2021  Procedures:  CT angiogram chest 05/19/2021 Chest x-ray 05/19/2021 2D echo 05/19/2021 Lower extremity Dopplers 05/19/2021     Antimicrobials:  None   Subjective: Laying in bed.  Feeling better than he did on admission.  Shortness of breath improving.  No chest pain.  No abdominal pain.  Complaining of some left calf tightness/swelling.   Objective: Vitals:   05/20/21 1558 05/20/21 2047  05/21/21 0330 05/21/21 0738  BP: 124/81 (!) 99/52 117/64 (!) 124/53  Pulse:  73 61   Resp:  16 20   Temp: 98.4 F (36.9 C) 98 F (36.7 C) 98 F (36.7 C) 98.3 F (36.8 C)  TempSrc:  Oral Oral   SpO2:  96% 96%   Weight:      Height:        Intake/Output Summary (Last 24 hours) at 05/21/2021 1033 Last data filed at 05/21/2021 0328 Gross per 24 hour  Intake 2394.24 ml  Output --  Net 2394.24 ml   Filed Weights   05/19/21 1141  Weight: 81.6 kg    Examination:  General exam: NAD. Respiratory system: CTA B.  No wheezes, no crackles, no rhonchi.  Normal respiratory effort.  Speaking in full sentences.  No use of accessory muscles of respiration. Cardiovascular system: Regular rate rhythm no murmurs rubs or gallops.  No JVD.  Left lower extremity swelling/edema.  Gastrointestinal system: Abdomen is soft, nontender, nondistended, positive bowel sounds.  No rebound.  No guarding.   Central nervous system: Alert and oriented. No focal neurological deficits. Extremities: Symmetric 5 x 5 power. Skin: No rashes, lesions or ulcers Psychiatry: Judgement and insight appear normal. Mood & affect appropriate.     Data Reviewed:   CBC: Recent Labs  Lab 05/19/21 1146 05/19/21 1156 05/19/21 1159 05/19/21 2033 05/20/21 0543 05/21/21 0049  WBC 11.1*  --   --  8.3 7.8 6.7  NEUTROABS 8.4*  --   --   --   --   --   HGB 12.4* 12.6* 14.3 11.1* 10.9* 10.4*  HCT 39.0 37.0* 42.0 36.1* 34.3* 31.9*  MCV 95.1  --   --  95.0 92.0 92.5  PLT 214  --   --  220 244 161    Basic Metabolic Panel: Recent Labs  Lab 05/19/21 1156 05/19/21 1159 05/20/21 0543 05/20/21 1018 05/21/21 0049  NA 135 136 135  --  134*  K 3.5 3.7 3.0*  --  3.5  CL  --  105 105  --  108  CO2  --   --  22  --  18*  GLUCOSE  --  109* 106*  --  106*  BUN  --  20 17  --  18  CREATININE  --  1.20 1.20  --  1.19  CALCIUM  --   --  8.1*  --  7.5*  MG  --   --   --  1.9  --     GFR: Estimated Creatinine Clearance: 54.3  mL/min (by C-G formula based on SCr of 1.19 mg/dL).  Liver Function Tests: Recent Labs  Lab 05/19/21 1146  AST 32  ALT 20  ALKPHOS 68  BILITOT 1.2  PROT 6.8  ALBUMIN 3.3*    CBG: No results for input(s): GLUCAP in the last 168 hours.   Recent Results (from the past 240 hour(s))  Resp Panel by RT-PCR (Flu A&B, Covid) Nasopharyngeal Swab     Status: None   Collection Time: 05/19/21  5:22 PM   Specimen: Nasopharyngeal Swab; Nasopharyngeal(NP) swabs in vial transport medium  Result Value Ref Range Status   SARS Coronavirus 2 by RT PCR NEGATIVE NEGATIVE Final    Comment: (NOTE) SARS-CoV-2 target nucleic acids are NOT DETECTED.  The SARS-CoV-2 RNA is generally detectable in upper respiratory specimens during the acute phase of infection. The lowest concentration of SARS-CoV-2 viral copies this assay can detect is 138 copies/mL. A negative result does not preclude SARS-Cov-2 infection and should not be used as the sole basis for treatment or other patient management decisions. A negative result may occur with  improper specimen collection/handling, submission of specimen other than nasopharyngeal swab, presence of viral mutation(s) within the areas targeted by this assay, and inadequate number of viral copies(<138 copies/mL). A negative result must be combined with clinical observations, patient history, and epidemiological information. The expected result is Negative.  Fact Sheet for Patients:  EntrepreneurPulse.com.au  Fact Sheet for Healthcare Providers:  IncredibleEmployment.be  This test is no t yet approved or cleared by the [place] FDA and  has been authorized for detection and/or diagnosis of SARS-CoV-2 by FDA under an Emergency Use Authorization (EUA). This EUA will remain  in effect (meaning this test can be used) for the duration of the COVID-19 declaration under Section 564(b)(1) of the Act, 21 U.S.C.section  360bbb-3(b)(1), unless the authorization is terminated  or revoked sooner.       Influenza A by PCR NEGATIVE NEGATIVE Final   Influenza B by PCR NEGATIVE NEGATIVE Final    Comment: (NOTE) The Xpert Xpress SARS-CoV-2/FLU/RSV plus assay is intended as an aid in the diagnosis of influenza from Nasopharyngeal swab specimens and should not be used as a sole basis for treatment. Nasal washings and aspirates are unacceptable for Xpert Xpress SARS-CoV-2/FLU/RSV testing.  Fact Sheet for Patients: EntrepreneurPulse.com.au  Fact Sheet for Healthcare Providers: IncredibleEmployment.be  This test  is not yet approved or cleared by the [place] and has been authorized for detection and/or diagnosis of SARS-CoV-2 by FDA under an Emergency Use Authorization (EUA). This EUA will remain in effect (meaning this test can be used) for the duration of the COVID-19 declaration under Section 564(b)(1) of the Act, 21 U.S.C. section 360bbb-3(b)(1), unless the authorization is terminated or revoked.  Performed at [facility], [place] [address] [place], [place] [address]          Radiology Studies: CT Angio Chest PE W/Cm &/Or Wo Cm  Result Date: 05/19/2021 CLINICAL DATA:  Pulmonary embolus EXAM: CT ANGIOGRAPHY CHEST WITH CONTRAST TECHNIQUE: Multidetector CT imaging of the chest was performed using the standard protocol during bolus administration of intravenous contrast. Multiplanar CT image reconstructions and MIPs were obtained to evaluate the vascular anatomy. RADIATION DOSE REDUCTION: This exam was performed according to the departmental dose-optimization program which includes automated exposure control, adjustment of the mA and/or kV according to patient size and/or use of iterative reconstruction technique. CONTRAST:  54mL OMNIPAQUE IOHEXOL 350 MG/ML SOLN COMPARISON:  None. FINDINGS: Cardiovascular: Pulmonary embolus of the distal right pulmonary  artery, right lobar arteries and segmental/subsegmental pulmonary arteries of the right lung and left lower lobe. Evidence of right heart strain with increased RV to LV ratio. Normal heart size. No pericardial effusion. Mild coronary artery calcifications of the LAD. Atherosclerotic disease of the thoracic aorta. Mediastinum/Nodes: Esophagus and thyroid are unremarkable. No pathologically enlarged lymph nodes seen in the chest. Lungs/Pleura: Central airways are patent. Mild patchy ground-glass opacities of the right lower lobe and right upper lobe. Bibasilar atelectasis. No consolidation, pleural effusion or pneumothorax. Upper Abdomen: Simple cyst of the left kidney and prominent left extrarenal pelvis no acute abnormality. Musculoskeletal: No chest wall abnormality. No acute or significant osseous findings. Review of the MIP images confirms the above findings. IMPRESSION: 1. Pulmonary embolus of the distal right pulmonary artery, right lobar arteries and segmental/subsegmental pulmonary arteries of the right lung and left lower lobe 2. CT evidence of right heart strain. 3. Mild patchy ground-glass opacities of the right lower lobe and right upper lobe, likely infectious or inflammatory. 4. Coronary artery calcifications and aortic Atherosclerosis (ICD10-I70.0). Critical Value/emergent results were called by telephone at the time of interpretation on 05/19/2021 at 2:32 pm to provider [facility] , who verbally acknowledged these results. Electronically Signed   By: Yetta Glassman M.D.   On: 05/19/2021 14:35   DG Chest Port 1 View  Result Date: 05/19/2021 CLINICAL DATA:  Shortness of breath. EXAM: PORTABLE CHEST 1 VIEW COMPARISON:  None. FINDINGS: The heart size and mediastinal contours are within normal limits. Both lungs are clear. The visualized skeletal structures are unremarkable. IMPRESSION: No active disease. Electronically Signed   By: Marijo Conception M.D.   On: 05/19/2021 12:11   VAS [place] LOWER  EXTREMITY VENOUS (DVT) (7a-7p)  Result Date: 05/19/2021  Lower Venous DVT Study Patient Name:  WOFFORD STRATTON  Date of Exam:   05/19/2021 Medical Rec #: [id number]      Accession #:    [id number] Date of Birth: 04-09-1935      Patient Gender: M Patient Age:   69 years Exam Location:  [facility] Procedure:      VAS [place] LOWER EXTREMITY VENOUS (DVT) Referring Phys: Daleen Bo --------------------------------------------------------------------------------  Indications: Pulmonary embolism, and Pain.  Comparison Study: no prior Performing Technologist: Archie Patten RVS  Examination Guidelines: A complete evaluation includes B-mode imaging,  spectral Doppler, color Doppler, and power Doppler as needed of all accessible portions of each vessel. Bilateral testing is considered an integral part of a complete examination. Limited examinations for reoccurring indications may be performed as noted. The reflux portion of the exam is performed with the patient in reverse Trendelenburg.  +---------+---------------+---------+-----------+----------+--------------+  RIGHT     Compressibility Phasicity Spontaneity Properties Thrombus Aging  +---------+---------------+---------+-----------+----------+--------------+  CFV       Full            Yes       Yes                                    +---------+---------------+---------+-----------+----------+--------------+  SFJ       Full                                                             +---------+---------------+---------+-----------+----------+--------------+  FV Prox   Full                                                             +---------+---------------+---------+-----------+----------+--------------+  FV Mid    Full                                                             +---------+---------------+---------+-----------+----------+--------------+  FV Distal Full                                                              +---------+---------------+---------+-----------+----------+--------------+  PFV       Full                                                             +---------+---------------+---------+-----------+----------+--------------+  POP       Full            Yes       Yes                                    +---------+---------------+---------+-----------+----------+--------------+  PTV       Full                                                             +---------+---------------+---------+-----------+----------+--------------+  PERO      Full                                                             +---------+---------------+---------+-----------+----------+--------------+   +---------+---------------+---------+-----------+----------+--------------+  LEFT      Compressibility Phasicity Spontaneity Properties Thrombus Aging  +---------+---------------+---------+-----------+----------+--------------+  CFV       Full            Yes       Yes                                    +---------+---------------+---------+-----------+----------+--------------+  SFJ       Full                                                             +---------+---------------+---------+-----------+----------+--------------+  FV Prox   None                                             Acute           +---------+---------------+---------+-----------+----------+--------------+  FV Mid    None                                             Acute           +---------+---------------+---------+-----------+----------+--------------+  FV Distal None                                             Acute           +---------+---------------+---------+-----------+----------+--------------+  PFV       Full                                                             +---------+---------------+---------+-----------+----------+--------------+  POP       None            No        No                     Acute            +---------+---------------+---------+-----------+----------+--------------+  PTV       Full                                                             +---------+---------------+---------+-----------+----------+--------------+  PERO      None                                             Acute           +---------+---------------+---------+-----------+----------+--------------+  Gastroc   None                                             Acute           +---------+---------------+---------+-----------+----------+--------------+     Summary: RIGHT: - There is no evidence of deep vein thrombosis in the lower extremity.  - No cystic structure found in the popliteal fossa.  LEFT: - Findings consistent with acute deep vein thrombosis involving the left femoral vein, left popliteal vein, left peroneal veins, and left gastrocnemius veins. - No cystic structure found in the popliteal fossa.  *See table(s) above for measurements and observations. Electronically signed by Harold Barban MD on 05/19/2021 at 11:21:08 PM.    Final    ECHOCARDIOGRAM LIMITED  Result Date: 05/19/2021    ECHOCARDIOGRAM LIMITED REPORT   Patient Name:   FINDLAY DAGHER Dodds Date of Exam: 05/19/2021 Medical Rec #:  [id number]     Height:       76.0 in Accession #:    [id number]    Weight:       180.0 lb Date of Birth:  05-15-1935     BSA:          2.118 m Patient Age:    43 years      BP:           146/70 mmHg Patient Gender: M             HR:           89 bpm. Exam Location:  Inpatient Procedure: Limited Echo Indications:    Pulmonary embolism  History:        Patient has no prior history of Echocardiogram examinations.  Sonographer:    Jefferey Pica Referring Phys: Clever  Sonographer Comments: Technically difficult study due to poor echo windows. Image acquisition challenging due to respiratory motion. IMPRESSIONS  1. Left ventricular ejection fraction, by estimation, is 50 to 55%. The left ventricle has low normal function.  2. + McConnell's  sign - c/w pulmonary embolus with RV strain . Marland Kitchen Right ventricular systolic function is moderately reduced. The right ventricular size is moderately enlarged.  3. The aortic valve is tricuspid. Aortic valve regurgitation is not visualized. No aortic stenosis is present. FINDINGS  Left Ventricle: Left ventricular ejection fraction, by estimation, is 50 to 55%. The left ventricle has low normal function. Right Ventricle: + McConnell's sign - c/w pulmonary embolus with RV strain. The right ventricular size is moderately enlarged. Right ventricular systolic function is moderately reduced. Aortic Valve: The aortic valve is tricuspid. Aortic valve regurgitation is not visualized. No aortic stenosis is present. LEFT VENTRICLE PLAX 2D LVIDd:         5.50 cm LVIDs:         3.50 cm LV PW:         1.10 cm LV IVS:        1.10 cm LVOT diam:     2.00 cm LVOT Area:  3.14 cm  IVC IVC diam: 2.60 cm LEFT ATRIUM           Index LA diam:      4.40 cm 2.08 cm/m LA Vol (A2C): 40.8 ml 19.26 ml/m   AORTA Ao Root diam: 4.80 cm  SHUNTS Systemic Diam: 2.00 cm Mertie Moores MD Electronically signed by Mertie Moores MD Signature Date/Time: 05/19/2021/5:17:16 PM    Final         Scheduled Meds:  brimonidine-timolol  1 drop Both Eyes BID   docusate sodium  100 mg Oral BID   dorzolamide  1 drop Both Eyes BID   finasteride  5 mg Oral Daily   latanoprost  1 drop Both Eyes QHS   methotrexate  15 mg Oral Weekly   pantoprazole  40 mg Oral Daily   sodium chloride flush  3 mL Intravenous Q12H   tamsulosin  0.4 mg Oral QPC breakfast   Continuous Infusions:  sodium chloride 75 mL/hr at 05/21/21 1024   heparin 1,900 Units/hr (05/21/21 0241)     LOS: 2 days    Time spent: 40 minutes    Irine Seal, MD [facility]   To contact the attending provider between 7A-7P or the covering provider during after hours 7P-7A, please log into the web site [email or web address] and access using universal [place] password for that  web site. If you do not have the password, please call the hospital operator.  05/21/2021, 10:33 AM

## 2021-05-22 LAB — BASIC METABOLIC PANEL
Anion gap: 7 (ref 5–15)
BUN: 13 mg/dL (ref 8–23)
CO2: 17 mmol/L — ABNORMAL LOW (ref 22–32)
Calcium: 7.1 mg/dL — ABNORMAL LOW (ref 8.9–10.3)
Chloride: 109 mmol/L (ref 98–111)
Creatinine, Ser: 1.01 mg/dL (ref 0.61–1.24)
GFR, Estimated: >60 mL/min (ref >60)
Glucose, Bld: 102 mg/dL — ABNORMAL HIGH (ref 70–99)
Potassium: 3.5 mmol/L (ref 3.5–5.1)
Sodium: 133 mmol/L — ABNORMAL LOW (ref 135–145)

## 2021-05-22 LAB — CBC
HCT: 30.4 % — ABNORMAL LOW (ref 39.0–52.0)
HCT: 30.6 % — ABNORMAL LOW (ref 39.0–52.0)
Hemoglobin: 9.5 g/dL — ABNORMAL LOW (ref 13.0–17.0)
Hemoglobin: 9.8 g/dL — ABNORMAL LOW (ref 13.0–17.0)
MCH: 28.7 pg (ref 26.0–34.0)
MCH: 29.5 pg (ref 26.0–34.0)
MCHC: 31 g/dL (ref 30.0–36.0)
MCHC: 32.2 g/dL (ref 30.0–36.0)
MCV: 91.6 fL (ref 80.0–100.0)
MCV: 92.4 fL (ref 80.0–100.0)
Platelets: 252 10*3/uL (ref 150–400)
Platelets: 260 10*3/uL (ref 150–400)
RBC: 3.31 MIL/uL — ABNORMAL LOW (ref 4.22–5.81)
RBC: 3.32 MIL/uL — ABNORMAL LOW (ref 4.22–5.81)
RDW: 16.6 % — ABNORMAL HIGH (ref 11.5–15.5)
RDW: 16.8 % — ABNORMAL HIGH (ref 11.5–15.5)
WBC: 5 10*3/uL (ref 4.0–10.5)
WBC: 5 10*3/uL (ref 4.0–10.5)
nRBC: 0 % (ref 0.0–0.2)
nRBC: 0 % (ref 0.0–0.2)

## 2021-05-22 LAB — HEPARIN LEVEL (UNFRACTIONATED): Heparin Unfractionated: 0.25 IU/mL — ABNORMAL LOW (ref 0.30–0.70)

## 2021-05-22 LAB — CANCER ANTIGEN 19-9: CA 19-9: 18 U/mL (ref 0–35)

## 2021-05-22 LAB — CEA: CEA: 1 ng/mL (ref 0.0–4.7)

## 2021-05-22 MED ORDER — APIXABAN 5 MG PO TABS
10.0000 mg | ORAL_TABLET | Freq: Two times a day (BID) | ORAL | Status: DC
  Administered 2021-05-22 – 2021-05-23 (×3): 10 mg via ORAL
  Filled 2021-05-22 (×3): qty 2

## 2021-05-22 MED ORDER — APIXABAN 5 MG PO TABS: 5.0000 mg | ORAL_TABLET | Freq: Two times a day (BID) | ORAL | Status: DC

## 2021-05-22 MED ORDER — POTASSIUM CHLORIDE CRYS ER 20 MEQ PO TBCR
40.0000 meq | EXTENDED_RELEASE_TABLET | Freq: Once | ORAL | Status: AC
Start: 2021-05-22 — End: 2021-05-22
  Administered 2021-05-22: 40 meq via ORAL
  Filled 2021-05-22: qty 2

## 2021-05-22 NOTE — Progress Notes (Signed)
ANTICOAGULATION CONSULT NOTE ? ?Pharmacy Consult for Heparin  ?Indication: pulmonary embolus and DVT ? ?No Known Allergies ? ?Patient Measurements: ?Height: '6\' 4"'$  (193 cm) ?Weight: 81.6 kg (180 lb) ?IBW/kg (Calculated) : 86.8 ? ?Heparin Dosing Weight: 82 kg ? ?Vital Signs: ?Temp: 98.3 ?F (36.8 ?C) (03/03 2000) ?Temp Source: Oral (03/03 2000) ?BP: 138/71 (03/03 2000) ?Pulse Rate: 77 (03/03 2000) ? ?Labs: ?Recent Labs  ?  05/19/21 ?1159 05/19/21 ?2033 05/20/21 ?0543 05/20/21 ?1009 05/21/21 ?8850 05/21/21 ?2774 05/21/21 ?1557 05/21/21 ?2346 05/22/21 ?0220  ?HGB 14.3 11.1* 10.9*  --  10.4*  --   --  9.5* 9.8*  ?HCT 42.0 36.1* 34.3*  --  31.9*  --   --  30.6* 30.4*  ?PLT  --  220 244  --  257  --   --  252 260  ?HEPARINUNFRC  --  0.18*  --    < > 0.25* 0.50 0.46  --  0.25*  ?CREATININE 1.20  --  1.20  --  1.19  --   --   --   --   ?TROPONINIHS  --  12  --   --   --  12  --   --   --   ? < > = values in this interval not displayed.  ? ? ? ?Estimated Creatinine Clearance: 54.3 mL/min (by C-G formula based on SCr of 1.19 mg/dL). ? ? ?Assessment: ?86 year old male who presented to the ED with shortness of breath and left lower extremity swelling found to have bilateral PE with right heart strain. Pharmacy consulted to manage heparin. ? ?Benefits check done for copays : $30 for Eliquis or Xarelto, 30 days supply. ? ?Heparin level now below goal at 0.25 with heparin rate of 1900 units/hr. CBC stable, platelets 260. No overt signs/symptoms bleeding noted.  ? ?Goal of Therapy:  ?Heparin level 0.3-0.7 units/ml ?Monitor platelets by anticoagulation protocol: Yes ?  ?Plan:  ?Increase IV heparin infusion to 2050 units/hr ?Check heparin level in 8 hours and daily while on heparin ?Continue to monitor H&H and platelets ? ? ? ?Thank you for allowing pharmacy to be a part of this patient?s care. ? ?Erin Hearing PharmD., BCPS ?Clinical Pharmacist ?05/22/2021 3:45 AM ? ?

## 2021-05-22 NOTE — Progress Notes (Signed)
Mobility Specialist Progress Note: ? ? 05/22/21 1457  ?Mobility  ?Activity Ambulated with assistance in hallway  ?Level of Assistance Standby assist, set-up cues, supervision of patient - no hands on  ?Assistive Device Front wheel walker  ?Distance Ambulated (ft) 220 ft  ?Activity Response Tolerated well  ?$Mobility charge 1 Mobility  ? ?Second walk of the Znya Albino. No complaints. Left in bed with call bell in reach and all needs met.  ? ?Modene Andy ?Mobility Specialist ?Primary Phone [phone] ? ?

## 2021-05-22 NOTE — Progress Notes (Signed)
ANTICOAGULATION CONSULT NOTE ? ?Pharmacy Consult for Heparin >> apixaban  ?Indication: pulmonary embolus and DVT ? ?No Known Allergies ? ?Patient Measurements: ?Height: '6\' 4"'$  (193 cm) ?Weight: 81.6 kg (180 lb) ?IBW/kg (Calculated) : 86.8 ? ?Heparin Dosing Weight: 82 kg ? ?Vital Signs: ?Temp: 98.2 ?F (36.8 ?C) (03/04 0730) ?Temp Source: Oral (03/04 0730) ?BP: 126/63 (03/04 0730) ?Pulse Rate: 65 (03/04 0730) ? ?Labs: ?Recent Labs  ?  05/19/21 ?2033 05/20/21 ?0543 05/20/21 ?1009 05/21/21 ?0240 05/21/21 ?9735 05/21/21 ?1557 05/21/21 ?2346 05/22/21 ?0220  ?HGB 11.1* 10.9*  --  10.4*  --   --  9.5* 9.8*  ?HCT 36.1* 34.3*  --  31.9*  --   --  30.6* 30.4*  ?PLT 220 244  --  257  --   --  252 260  ?HEPARINUNFRC 0.18*  --    < > 0.25* 0.50 0.46  --  0.25*  ?CREATININE  --  1.20  --  1.19  --   --   --  1.01  ?TROPONINIHS 12  --   --   --  12  --   --   --   ? < > = values in this interval not displayed.  ? ? ? ?Estimated Creatinine Clearance: 64 mL/min (by C-G formula based on SCr of 1.01 mg/dL). ? ? ?Assessment: ?86 year old male who presented to the ED with shortness of breath and left lower extremity swelling found to have bilateral PE with right heart strain and L DVT. Pharmacy consulted to change from heparin to apixaban. ? ?Benefits check done for copays : $30 for Eliquis or Xarelto, 30 days supply. ? ?Last heparin level sub-therapeutic at 0.25. Even though heparin started 3/1, therapeutic heparin levels only about 1 day. Will transition to apixaban at this time with full loading dose. Hgb stable at 9.8, plts wnl and stable at 260. Renal function stable, creatinine 1.01, eCrCl ~64 ml/min.  ? ?Goal of Therapy:  ?Monitor platelets by anticoagulation protocol: Yes ?  ?Plan:  ?Discontinue heparin infusion  ?Start apixaban 10 mg PO BID x7 days then transition to 5 mg PO BID thereafter (first dose of 5 mg on 3/11) ?Continue to monitor bleeding, H&H, and platelets ?Pt would benefit from counseling on apixaban  ? ? ? ?Thank you  for allowing pharmacy to be a part of this patient?s care. ? ?Eddie Candle, PharmD, BCPS ?Clinical Pharmacist ?05/22/2021 9:55 AM ? ?

## 2021-05-22 NOTE — Plan of Care (Signed)

## 2021-05-22 NOTE — Progress Notes (Signed)
PROGRESS NOTE    Bradley Lewis  OEU:[id number] DOB: 11-28-1935 DOA: 05/19/2021 PCP: [name], MD    Chief Complaint  Patient presents with   Shortness of Breath    3 days    Brief Narrative:  Patient is a pleasant 86 year old gentleman history of BPH and glaucoma presented to the ED with shortness of breath and left lower extremity swelling.  Shortness of breath noted on minimal exertion.  Patient however active.  Patient with no prior history of DVT or PE, no recent long trips, no recent surgeries.  Patient not on O2 prior to admission.  Patient presented to PCPs office on the morning of admission and sent by ambulance to the ED.  Work-up in the ED including CT angiogram chest with bilateral PE with right heart strain.  PESI score class III, intermediate risk.  S-PESI score noted to be high.  Lower extremity Dopplers done consistent with a left lower extremity DVT.  2D echo done with positive McConnell sign.  IR consulted for evaluation for possible thrombolysis versus thrombectomy.  Patient placed empirically on IV heparin for anticoagulation.  PCCM also consulted for further evaluation and management.    Assessment & Plan:  Principal Problem:   Acute pulmonary embolism with acute cor pulmonale (HCC) Active Problems:   Acute deep vein thrombosis (DVT) of left lower extremity (HCC)   BPH (benign prostatic hyperplasia)   Glaucoma   Hypokalemia    Assessment and Plan: * Acute pulmonary embolism with acute cor pulmonale (HCC) -Patient without prior episodes of thromboembolic disease, no recent surgeries, no recent long trips, presenting with new LLE DVT/PE -Patient is not showing evidence of hemodynamic instability at this time although blood pressure is soft. -Given his hemodynamic stability, his is at intermediate risk -PESI score is Class III, intermediate risk, indicating a 3.2-7.1% 30-day mortality risk -S-PESI score is high, indicating that the patient has a 8.9 risk of  death  -CT chest done with bilateral PE with right heart strain noted -2D echo with McConnell sign.   -Lower extremity Dopplers consistent with left lower extremity DVT.  -Cardiac enzymes negative. -With high PESI/S-PESI and R heart strain, he is at intermediate risk at a minimum; and as such IR consulted for consideration for intervention.  -Patient seen in consultation by IR, patient noted to be hemodynamically stable and at this time did not recommend any intervention and recommending only anticoagulation with IV heparin.   -[place] O2 as needed, currently on RA with sats of 95%. -It was noted by IR that RN stated patient desaturated with ambulation today 05/21/2021. -Admitting physician, Dr. Lorin Mercy discussed oral AC treatment options and risks/benefits including frequent MD visits and dietary regulation with Coumadin vs. Significant expense with DOAC therapy. -Patient hemodynamically stable, respiratory status improved currently 100% on room air. -Patients are at intermediate risk (3-8%/year) for recurrent VTE if initial clot occurred with no identifiable RF.  Extended oral anticoagulation of indefinite duration should be considered for patients with a first episode of PE with these issues. He is interested in maintaining a very active lifestyle and prefers to limit AC to as short a duration as possible -The patient understands that thromboembolic disease can be catastrophic and even deadly and that he must be complaint with physician appointments and anticoagulation. -Due to soft blood pressure patient placed on gentle hydration with improvement with blood pressure.  -IR following and appreciate input and recommendations -PCCM consulted for further evaluation and recommendations in terms of lytics.  It is  noted per PCCM note that patient did show some unintentional weight loss and abdominal bloating sensation for which she takes probiotics. -PCCM assessed the patient and PSA obtained at 0.89, CEA 1.0, CA  19-9 was 18.   -CT abdomen and pelvis obtained showed mural thickening in the gastric cardia, PCCM recommended evaluation by GI for probable upper endoscopy at least 6 weeks post discharge.   -PCCM feels patient will likely need lifelong use of anticoagulation unless a contraindication given unprovoked nature of the clot.  -Per PCCM if patient still short of breath in 3 months from now will need a repeat 2D echo.  - Transition from IV heparin to Eliquis.  -Consult TOC for help with medication cost. -May need outpatient follow-up with PCCM.  -Appreciate PCCM input and recommendations.    Acute deep vein thrombosis (DVT) of left lower extremity (HCC) - Patient presenting with shortness of breath on minimal exertion, work-up with acute PE. -Lower extremity Dopplers done positive for left lower extremity femoral vein, popliteal vein, peroneal veins, gastrocnemius vein DVT. -Transition from IV heparin to Eliquis.   Hypokalemia - Repleted, potassium at 3.5.  -K-Dur 40 mEq p.o. x1.  -Repeat labs in the AM.    Glaucoma -Continue latanoprost, dorzolamide, Combigan  BPH (benign prostatic hyperplasia) -Continue proscar, rapaflo (Flomax per formulary)         DVT prophylaxis: Heparin>>> Eliquis Code Status: Full Family Communication: Updated patient.  No family at bedside. Disposition: Likely home when clinically improved and on oral anticoagulation.  Status is: Inpatient Remains inpatient appropriate because: Severity of illness           Consultants:  Interventional radiology: Dr.Mugweru 05/19/2021 PCCM: Dr. Tamala Julian 05/21/2021  Procedures:  CT angiogram chest 05/19/2021 Chest x-ray 05/19/2021 2D echo 05/19/2021 Lower extremity Dopplers 05/19/2021 CT abdomen and pelvis 05/21/2021     Antimicrobials:  None   Subjective: Noted ambulating in the hallway with walker with mobility specialist.  Patient denies any chest pain.  States shortness of breath on exertion has improved since  admission.  Complaining of left calf swelling and tightness.  Objective: Vitals:   05/21/21 0738 05/21/21 2000 05/22/21 0425 05/22/21 0730  BP: (!) 124/53 138/71 122/65 126/63  Pulse:  77 64 65  Resp:  '18 18 19  '$ Temp: 98.3 F (36.8 C) 98.3 F (36.8 C)  98.2 F (36.8 C)  TempSrc:  Oral  Oral  SpO2:  96% 97% 100%  Weight:      Height:        Intake/Output Summary (Last 24 hours) at 05/22/2021 0943 Last data filed at 05/22/2021 0755 Gross per 24 hour  Intake 1200 ml  Output 1900 ml  Net -700 ml   Filed Weights   05/19/21 1141  Weight: 81.6 kg    Examination:  General exam: NAD. Respiratory system: Lungs clear to auscultation bilaterally.  No wheezes, no crackles, no rhonchi.  Normal respiratory effort.  Speaking in full sentences.  Cardiovascular system: RRR no murmurs rubs or gallops.  No JVD.  Left lower extremity swelling/edema.   Gastrointestinal system: Abdomen is soft, nontender, nondistended, positive bowel sounds.  No rebound.  No guarding.  Central nervous system: Alert and oriented. No focal neurological deficits. Extremities: Symmetric 5 x 5 power. Skin: No rashes, lesions or ulcers Psychiatry: Judgement and insight appear normal. Mood & affect appropriate.     Data Reviewed:   CBC: Recent Labs  Lab 05/19/21 1146 05/19/21 1156 05/19/21 2033 05/20/21 0543 05/21/21 0049 05/21/21 2346 05/22/21 0220  WBC 11.1*  --  8.3 7.8 6.7 5.0 5.0  NEUTROABS 8.4*  --   --   --   --   --   --   HGB 12.4*   < > 11.1* 10.9* 10.4* 9.5* 9.8*  HCT 39.0   < > 36.1* 34.3* 31.9* 30.6* 30.4*  MCV 95.1  --  95.0 92.0 92.5 92.4 91.6  PLT 214  --  220 244 257 252 260   < > = values in this interval not displayed.    Basic Metabolic Panel: Recent Labs  Lab 05/19/21 1156 05/19/21 1159 05/20/21 0543 05/20/21 1018 05/21/21 0049 05/22/21 0220  NA 135 136 135  --  134* 133*  K 3.5 3.7 3.0*  --  3.5 3.5  CL  --  105 105  --  108 109  CO2  --   --  22  --  18* 17*  GLUCOSE   --  109* 106*  --  106* 102*  BUN  --  20 17  --  18 13  CREATININE  --  1.20 1.20  --  1.19 1.01  CALCIUM  --   --  8.1*  --  7.5* 7.1*  MG  --   --   --  1.9  --   --     GFR: Estimated Creatinine Clearance: 64 mL/min (by C-G formula based on SCr of 1.01 mg/dL).  Liver Function Tests: Recent Labs  Lab 05/19/21 1146  AST 32  ALT 20  ALKPHOS 68  BILITOT 1.2  PROT 6.8  ALBUMIN 3.3*    CBG: No results for input(s): GLUCAP in the last 168 hours.   Recent Results (from the past 240 hour(s))  Resp Panel by RT-PCR (Flu A&B, Covid) Nasopharyngeal Swab     Status: None   Collection Time: 05/19/21  5:22 PM   Specimen: Nasopharyngeal Swab; Nasopharyngeal(NP) swabs in vial transport medium  Result Value Ref Range Status   SARS Coronavirus 2 by RT PCR NEGATIVE NEGATIVE Final    Comment: (NOTE) SARS-CoV-2 target nucleic acids are NOT DETECTED.  The SARS-CoV-2 RNA is generally detectable in upper respiratory specimens during the acute phase of infection. The lowest concentration of SARS-CoV-2 viral copies this assay can detect is 138 copies/mL. A negative result does not preclude SARS-Cov-2 infection and should not be used as the sole basis for treatment or other patient management decisions. A negative result may occur with  improper specimen collection/handling, submission of specimen other than nasopharyngeal swab, presence of viral mutation(s) within the areas targeted by this assay, and inadequate number of viral copies(<138 copies/mL). A negative result must be combined with clinical observations, patient history, and epidemiological information. The expected result is Negative.  Fact Sheet for Patients:  EntrepreneurPulse.com.au  Fact Sheet for Healthcare Providers:  IncredibleEmployment.be  This test is no t yet approved or cleared by the [place] FDA and  has been authorized for detection and/or diagnosis of SARS-CoV-2 by FDA  under an Emergency Use Authorization (EUA). This EUA will remain  in effect (meaning this test can be used) for the duration of the COVID-19 declaration under Section 564(b)(1) of the Act, 21 U.S.C.section 360bbb-3(b)(1), unless the authorization is terminated  or revoked sooner.       Influenza A by PCR NEGATIVE NEGATIVE Final   Influenza B by PCR NEGATIVE NEGATIVE Final    Comment: (NOTE) The Xpert Xpress SARS-CoV-2/FLU/RSV plus assay is intended as an aid in the diagnosis of influenza from Nasopharyngeal  swab specimens and should not be used as a sole basis for treatment. Nasal washings and aspirates are unacceptable for Xpert Xpress SARS-CoV-2/FLU/RSV testing.  Fact Sheet for Patients: EntrepreneurPulse.com.au  Fact Sheet for Healthcare Providers: IncredibleEmployment.be  This test is not yet approved or cleared by the [place] FDA and has been authorized for detection and/or diagnosis of SARS-CoV-2 by FDA under an Emergency Use Authorization (EUA). This EUA will remain in effect (meaning this test can be used) for the duration of the COVID-19 declaration under Section 564(b)(1) of the Act, 21 U.S.C. section 360bbb-3(b)(1), unless the authorization is terminated or revoked.  Performed at [facility], [place] [address], [place], [place] [address]          Radiology Studies: CT ABDOMEN PELVIS W CONTRAST  Result Date: 05/21/2021 CLINICAL DATA:  Unintended weight loss, left lower extremity edema EXAM: CT ABDOMEN AND PELVIS WITH CONTRAST TECHNIQUE: Multidetector CT imaging of the abdomen and pelvis was performed using the standard protocol following bolus administration of intravenous contrast. RADIATION DOSE REDUCTION: This exam was performed according to the departmental dose-optimization program which includes automated exposure control, adjustment of the mA and/or kV according to patient size and/or use of iterative  reconstruction technique. CONTRAST:  136m OMNIPAQUE IOHEXOL 300 MG/ML  SOLN COMPARISON:  05/19/2021 FINDINGS: Lower chest: The segmental right lower lobe pulmonary embolus seen on prior study is again identified. Peripheral ground-glass consolidation within the right lower lobe again noted and unchanged, which could reflect localized airspace disease, scarring, or developing infarct. No effusion. Hepatobiliary: No focal liver abnormality is seen. No gallstones, gallbladder wall thickening, or biliary dilatation. Pancreas: Unremarkable. No pancreatic ductal dilatation or surrounding inflammatory changes. Spleen: Normal in size without focal abnormality. Adrenals/Urinary Tract: Moderate left renal cortical atrophy and scarring. Right kidney enhances normally. No urinary tract calculi or obstructive uropathy within either kidney. Bilateral extrarenal pelves, left more prominent than right. Simple cyst upper pole left kidney. The adrenals are grossly unremarkable. Small diverticulum off the left anterolateral aspect of the bladder. No filling defect. Stomach/Bowel: No bowel obstruction or ileus. Diffuse colonic gas fluid levels may reflect diarrhea or recent laxative/enema use. Normal appendix right lower quadrant. There is possible mural thickening within the gastric cardia, though evaluation is limited due to lack of oral contrast administration. If abnormality of the gastric cardia is suspected or clinically important to document, upper endoscopy could be considered. Vascular/Lymphatic: Mild atherosclerosis throughout the abdominal aorta. There is asymmetric dilation of the left common femoral and external iliac vein, without evidence of filling defect. Given the recent presentation of acute pulmonary embolus, left lower extremity DVT cannot be excluded. Correlation with Doppler ultrasound recommended. No pathologic adenopathy within the abdomen or pelvis. Reproductive: Prostate is unremarkable. Other: No free fluid  or free gas.  No abdominal wall hernia. 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 Prior healed bilateral inferior rami fractures. There are no acute or destructive bony lesions. Reconstructed images demonstrate no additional findings. IMPRESSION: 1. Colonic gas fluid levels which may reflect diarrhea or recent laxative/enema use. No bowel obstruction or ileus. 2. Asymmetric dilation of the left external iliac and common femoral vein, without obvious filling defect. Given recent diagnosis of pulmonary embolus, left lower extremity DVT cannot be excluded. Left lower extremity Doppler duplex ultrasound may be useful. 3. Nonspecific gastric cardia wall thickening given decompressed state and lack of oral contrast administration. If gastric pathology is suspected, upper endoscopy may be useful. 4. Segmental right  lower lobe pulmonary embolus again identified. Please see previous CT discussion 05/19/2021. 5. Stable peripheral consolidation within the right lower lobe, which may reflect acute airspace disease, scarring, or pulmonary infarct. 6.  Aortic Atherosclerosis (ICD10-I70.0). Electronically Signed   By: Randa Ngo M.D.   On: 05/21/2021 18:10        Scheduled Meds:  brimonidine-timolol  1 drop Both Eyes BID   docusate sodium  100 mg Oral BID   dorzolamide  1 drop Both Eyes BID   finasteride  5 mg Oral Daily   latanoprost  1 drop Both Eyes QHS   methotrexate  15 mg Oral Weekly   pantoprazole  40 mg Oral Daily   sodium chloride flush  3 mL Intravenous Q12H   tamsulosin  0.4 mg Oral QPC breakfast   Continuous Infusions:  sodium chloride 75 mL/hr at 05/21/21 1024   heparin 2,050 Units/hr (05/22/21 0806)     LOS: 3 days    Time spent: 40 minutes    Irine Seal, MD [facility]   To contact the attending provider between 7A-7P or the covering provider during after hours 7P-7A, please log into the web site [email or web address] and  access using universal [place] password for that web site. If you do not have the password, please call the hospital operator.  05/22/2021, 9:43 AM

## 2021-05-22 NOTE — Progress Notes (Signed)
Mobility Specialist Progress Note: ? ?SATURATION QUALIFICATIONS: (This note is used to comply with regulatory documentation for home oxygen) ? ?Patient Saturations on Room Air at Rest = 100% ? ?Patient Saturations on Room Air while Ambulating = 92%-95% ? ?Patient Saturations on n/a Liters of oxygen while Ambulating = n/a% ? ?Bradley Lewis ?Mobility Specialist ?Primary Phone [phone] ? ?

## 2021-05-22 NOTE — Plan of Care (Signed)
  Problem: Education: Goal: Knowledge of General Education information will improve Description: Including pain rating scale, medication(s)/side effects and non-pharmacologic comfort measures Outcome: Progressing   Problem: Activity: Goal: Risk for activity intolerance will decrease Outcome: Progressing   

## 2021-05-22 NOTE — Progress Notes (Signed)
Occupational Therapy Treatment ?Patient Details ?Name: Bradley Lewis ?MRN: [id number] ?DOB: 09/29/1935 ?Today's Date: 05/22/2021 ? ? ?History of present illness Pt is a 86 y.o. M who presents 05/19/2021 with shortness of breath and LLE swelling. Found to have acute pulmonary embolism. Significant PMH: BPH, glaucoma. ?  ?OT comments ? Pt. Seen for skilled OT session. Very active and motivated to participate.  Able to safely demo simulated shower stall transfer and complete LB dressing task.  Plan for HEP next session. Making good progress with all other acute OT goals.    ? ?Recommendations for follow up therapy are one component of a multi-disciplinary discharge planning process, led by the attending physician.  Recommendations may be updated based on patient status, additional functional criteria and insurance authorization. ?   ?Follow Up Recommendations ? No OT follow up  ?  ?Assistance Recommended at Discharge PRN  ?Patient can return home with the following ?   ?  ?Equipment Recommendations ? None recommended by OT  ?  ?Recommendations for Other Services   ? ?  ?Precautions / Restrictions Precautions ?Precautions: Fall  ? ? ?  ? ?Mobility Bed Mobility ?  ?  ?  ?  ?  ?  ?  ?  ?  ? ?Transfers ?Overall transfer level: Modified independent ?  ?  ?  ?  ?  ?  ?  ?  ?  ?  ?  ?Balance   ?  ?  ?  ?  ?  ?  ?  ?  ?  ?  ?  ?  ?  ?  ?  ?  ?  ?  ?   ? ?ADL either performed or assessed with clinical judgement  ? ?ADL Overall ADL's : Needs assistance/impaired ?  ?  ?  ?  ?  ?  ?  ?  ?  ?  ?Lower Body Dressing: Set up;Sitting/lateral leans ?Lower Body Dressing Details (indicate cue type and reason): donning socks seated EOB using figure 4 position ?  ?  ?  ?  ?Tub/ Shower Transfer: Min guard;Ambulation ?Tub/Shower Transfer Details (indicate cue type and reason): simulated for shower stall with "average"  reported ledge ?Functional mobility during ADLs: Min guard ?General ADL Comments: moving well, no issues noted with simulated  shower stall transfer and LB dressing task. ?  ? ?Extremity/Trunk Assessment   ?  ?  ?  ?  ?  ? ?Vision   ?  ?  ?Perception   ?  ?Praxis   ?  ? ?Cognition   ?  ?  ?  ?  ?  ?  ?  ?  ?  ?  ?  ?  ?  ?  ?  ?  ?  ?  ?  ?  ?  ?   ?Exercises   ? ?  ?Shoulder Instructions   ? ? ?  ?General Comments  Works out regularly and plays golf  ? ? ?Pertinent Vitals/ Pain       Pain Assessment ?Pain Assessment: No/denies pain ? ?Home Living   ?  ?  ?  ?  ?  ?  ?  ?  ?  ?  ?  ?  ?  ?  ?  ?  ?  ?  ? ?  ?Prior Functioning/Environment    ?  ?  ?  ?   ? ?Frequency ? Min 2X/week  ? ? ? ? ?  ?Progress Toward Goals ? ?  OT Goals(current goals can now be found in the care plan section) ? Progress towards OT goals: Progressing toward goals ? ?   ?Plan Discharge plan remains appropriate   ? ?Co-evaluation ? ? ?   ?  ?  ?  ?  ? ?  ?AM-PAC OT "6 Clicks" Daily Activity     ?Outcome Measure ? ? Help from another person eating meals?: None ?Help from another person taking care of personal grooming?: A Little ?Help from another person toileting, which includes using toliet, bedpan, or urinal?: A Little ?Help from another person bathing (including washing, rinsing, drying)?: A Little ?Help from another person to put on and taking off regular upper body clothing?: A Little ?Help from another person to put on and taking off regular lower body clothing?: A Little ?6 Click Score: 19 ? ?  ?End of Session   ? ?OT Visit Diagnosis: Muscle weakness (generalized) (M62.81) ?  ?Activity Tolerance Patient tolerated treatment well ?  ?Patient Left in bed;with call bell/phone within reach ?  ?Nurse Communication Other (comment) (alerted rn iv needed to be restarted as it was on hold) ?  ? ?   ? ?Time: 1324-4010 ?OT Time Calculation (min): 14 min ? ?Charges: OT General Charges ?$OT Visit: 1 Visit ?OT Treatments ?$Self Care/Home Management : 8-22 mins ? ?Sonia Baller, COTA/L ?Acute Rehabilitation ?[phone]  ? ?Bradley Lewis ?05/22/2021, 12:03 PM ?

## 2021-05-22 NOTE — Progress Notes (Signed)
Mobility Specialist Progress Note: ? ? 05/22/21 0947  ?Mobility  ?Activity Ambulated with assistance in hallway  ?Level of Assistance Standby assist, set-up cues, supervision of patient - no hands on  ?Assistive Device Front wheel walker  ?Distance Ambulated (ft) 350 ft  ?Activity Response Tolerated well  ?$Mobility charge 1 Mobility  ? ?Pt received in bed willing to participate in mobility. No complaints of pain just calf tightness. Left Eob with call bell in reach and all needs met. Will follow-up shortly for ambulatory pulse oximetry test.  ? ?Bradley Lewis ?Mobility Specialist ?Primary Phone [phone] ? ?

## 2021-05-23 DIAGNOSIS — I824Y2 Acute embolism and thrombosis of unspecified deep veins of left proximal lower extremity: Secondary | ICD-10-CM

## 2021-05-23 LAB — CBC
HCT: 30.6 % — ABNORMAL LOW (ref 39.0–52.0)
Hemoglobin: 9.6 g/dL — ABNORMAL LOW (ref 13.0–17.0)
MCH: 29 pg (ref 26.0–34.0)
MCHC: 31.4 g/dL (ref 30.0–36.0)
MCV: 92.4 fL (ref 80.0–100.0)
Platelets: 295 10*3/uL (ref 150–400)
RBC: 3.31 MIL/uL — ABNORMAL LOW (ref 4.22–5.81)
RDW: 16.7 % — ABNORMAL HIGH (ref 11.5–15.5)
WBC: 4.2 10*3/uL (ref 4.0–10.5)
nRBC: 0 % (ref 0.0–0.2)

## 2021-05-23 LAB — BASIC METABOLIC PANEL
Anion gap: 8 (ref 5–15)
BUN: 12 mg/dL (ref 8–23)
CO2: 17 mmol/L — ABNORMAL LOW (ref 22–32)
Calcium: 7.3 mg/dL — ABNORMAL LOW (ref 8.9–10.3)
Chloride: 109 mmol/L (ref 98–111)
Creatinine, Ser: 0.92 mg/dL (ref 0.61–1.24)
GFR, Estimated: >60 mL/min (ref >60)
Glucose, Bld: 101 mg/dL — ABNORMAL HIGH (ref 70–99)
Potassium: 3.8 mmol/L (ref 3.5–5.1)
Sodium: 134 mmol/L — ABNORMAL LOW (ref 135–145)

## 2021-05-23 MED ORDER — SODIUM BICARBONATE 650 MG PO TABS
650.0000 mg | ORAL_TABLET | Freq: Two times a day (BID) | ORAL | Status: DC
  Administered 2021-05-23: 650 mg via ORAL
  Filled 2021-05-23: qty 1

## 2021-05-23 MED ORDER — ACETAMINOPHEN 325 MG PO TABS: 650.0000 mg | ORAL_TABLET | Freq: Four times a day (QID) | ORAL | Status: AC | PRN

## 2021-05-23 MED ORDER — SODIUM BICARBONATE 650 MG PO TABS: 650.0000 mg | ORAL_TABLET | Freq: Two times a day (BID) | ORAL | 0 refills | Status: AC

## 2021-05-23 MED ORDER — APIXABAN 5 MG PO TABS: ORAL_TABLET | ORAL | 2 refills | Status: AC

## 2021-05-23 NOTE — Progress Notes (Signed)
Mobility Specialist Progress Note: ? ? 05/23/21 0946  ?Mobility  ?Activity Ambulated with assistance in hallway  ?Level of Assistance Standby assist, set-up cues, supervision of patient - no hands on  ?Assistive Device Front wheel walker  ?Distance Ambulated (ft) 350 ft  ?Activity Response Tolerated well  ?$Mobility charge 1 Mobility  ? ?Pt received in bed willing to participate in mobility. No complaints of pain and stated his leg is feeling less "tight". Left in bed with call bell in reach and all needs met.  ? ?Ilaria Much ?Mobility Specialist ?Primary Phone [phone] ? ?

## 2021-05-23 NOTE — Discharge Instructions (Signed)
Information on my medicine - ELIQUIS? (apixaban) ? ?This medication education was reviewed with me or my healthcare representative as part of my discharge preparation.  The pharmacist that spoke with me during my hospital stay was:  ? ? ?Why was Eliquis? prescribed for you? ?Eliquis? was prescribed to treat blood clots that may have been found in the veins of your legs (deep vein thrombosis) or in your lungs (pulmonary embolism) and to reduce the risk of them occurring again. ? ?What do You need to know about Eliquis? ? ?The starting dose is 10 mg (two 5 mg tablets) taken TWICE daily for the FIRST SEVEN (7) DAYS, then on (enter date)  05/29/20  the dose is reduced to ONE 5 mg tablet taken TWICE daily.  Eliquis? may be taken with or without food.  ? ?Try to take the dose about the same time in the morning and in the evening. If you have difficulty swallowing the tablet whole please discuss with your pharmacist how to take the medication safely. ? ?Take Eliquis? exactly as prescribed and DO NOT stop taking Eliquis? without talking to the doctor who prescribed the medication.  Stopping may increase your risk of developing a new blood clot.  Refill your prescription before you run out. ? ?After discharge, you should have regular check-up appointments with your healthcare provider that is prescribing your Eliquis?. ?   ?What do you do if you miss a dose? ?If a dose of ELIQUIS? is not taken at the scheduled time, take it as soon as possible on the same day and twice-daily administration should be resumed. The dose should not be doubled to make up for a missed dose. ? ?Important Safety Information ?A possible side effect of Eliquis? is bleeding. You should call your healthcare provider right away if you experience any of the following: ?Bleeding from an injury or your nose that does not stop. ?Unusual colored urine (red or dark brown) or unusual colored stools (red or black). ?Unusual bruising for unknown reasons. ?A  serious fall or if you hit your head (even if there is no bleeding). ? ?Some medicines may interact with Eliquis? and might increase your risk of bleeding or clotting while on Eliquis?Marland Kitchen To help avoid this, consult your healthcare provider or pharmacist prior to using any new prescription or non-prescription medications, including herbals, vitamins, non-steroidal anti-inflammatory drugs (NSAIDs) and supplements. ? ?This website has more information on Eliquis? (apixaban): http://www.eliquis.com/eliquis/home ? ?

## 2021-05-23 NOTE — Plan of Care (Signed)
  Problem: Activity: Goal: Risk for activity intolerance will decrease Outcome: Progressing   Problem: Elimination: Goal: Will not experience complications related to bowel motility Outcome: Progressing   Problem: Pain Managment: Goal: General experience of comfort will improve Outcome: Progressing   Problem: Safety: Goal: Ability to remain free from injury will improve Outcome: Progressing   

## 2021-05-23 NOTE — Discharge Summary (Signed)
Physician Discharge Summary  Bradley Lewis:[id number] DOB: 06/06/1935 DOA: 05/19/2021  PCP: Bradley Cowman, MD  Admit date: 05/19/2021 Discharge date: 05/23/2021  Time spent: 60 minutes  Recommendations for Outpatient Follow-up:  Follow-up with Bradley Cowman, MD in 1 week.  On follow-up patient need a basic metabolic profile to follow-up on electrolytes, renal function as well as a CBC done to follow-up on H&H.  Patient will benefit from referral to gastroenterology in about 6 weeks to follow-up on mural thickening of the gastric cardia noted on CT abdomen and pelvis for further evaluation as may need upper endoscopy.  Patient will likely need to be on anticoagulation lifelong per PCCM recommendations   Discharge Diagnoses:  Principal Problem:   Acute pulmonary embolism with acute cor pulmonale (HCC) Active Problems:   Acute deep vein thrombosis (DVT) of left lower extremity (HCC)   BPH (benign prostatic hyperplasia)   Glaucoma   Hypokalemia   Discharge Condition: Stable and improved  Diet recommendation: Regular diet  Filed Weights   05/19/21 1141  Weight: 81.6 kg    History of present illness:  HPI per Dr. Drexel Lewis is a 86 y.o. male with medical history significant of BPH and glaucoma presenting with SOB.  He reports that he wasn't feeling well and had swelling in his leg and SOB.  He wasn't able to function as he usually does.  Symptoms were gradual and he is not sure how long it has gone on.  He saw his PCP this AM and was sent by ambulance to the ER.  He is very slow to recover, particularly when going up stairs.  He walks 8 miles a week and goes to the gym twice a week.  He also golfs 3 days a week.  His last colonoscopy was at 24.  He gets his prostate checked routinely.  He had some stomach issues last fall and he was placed on probiotics with improvement.  He did lose weight as a result.  He hasn't been able to gain it back.       ER Course:  LLE DVT with PE.   Mild heart strain, not on O2.   Hospital Course:   Assessment and Plan: * Acute pulmonary embolism with acute cor pulmonale (HCC) -Patient without prior episodes of thromboembolic disease, no recent surgeries, no recent long trips, presenting with new LLE DVT/PE -Patient is not showing evidence of hemodynamic instability at this time although blood pressure is soft. -Given his hemodynamic stability, his is at intermediate risk -PESI score is Class III, intermediate risk, indicating a 3.2-7.1% 30-day mortality risk -S-PESI score is high, indicating that the patient has a 8.9 risk of death  -CT chest done with bilateral PE with right heart strain noted -2D echo with McConnell sign.   -Lower extremity Dopplers consistent with left lower extremity DVT.  -Cardiac enzymes negative. -With high PESI/S-PESI and R heart strain, he is at intermediate risk at a minimum; and as such IR consulted for consideration for intervention.  -Patient seen in consultation by IR, patient noted to be hemodynamically stable and at this time did not recommend any intervention and recommending only anticoagulation with IV heparin.   -[place] O2 as needed, currently on RA with sats of 95%. -It was noted by IR that RN stated patient desaturated with ambulation on 05/21/2021. -Admitting physician, Dr. Lorin Lewis discussed oral [facility] treatment options and risks/benefits including frequent MD visits and dietary regulation with Coumadin vs. Significant expense with DOAC therapy. -  Patient hemodynamically stable, respiratory status improved currently 100% on room air. -Patients are at intermediate risk (3-8%/year) for recurrent VTE if initial clot occurred with no identifiable RF.  Extended oral anticoagulation of indefinite duration should be considered for patients with a first episode of PE with these issues. He is interested in maintaining a very active lifestyle and prefers to limit AC to as short a duration as possible -The patient  understands that thromboembolic disease can be catastrophic and even deadly and that he must be complaint with physician appointments and anticoagulation. -Due to soft blood pressure patient placed on gentle hydration with improvement with blood pressure.  -IR following and appreciate input and recommendations -PCCM consulted for further evaluation and recommendations in terms of lytics.  It is noted per PCCM note that patient did show some unintentional weight loss and abdominal bloating sensation for which she takes probiotics. -PCCM assessed the patient and PSA obtained at 0.89, CEA 1.0, CA 19-9 was 18.   -CT abdomen and pelvis obtained showed mural thickening in the gastric cardia, PCCM recommended evaluation by GI for probable upper endoscopy at least 6 weeks post discharge.   -PCCM feels patient will likely need lifelong use of anticoagulation unless a contraindication given unprovoked nature of the clot.  -Per PCCM if patient still short of breath in 3 months from now will need a repeat 2D echo.  - Transitioned from IV heparin to Eliquis.  -Patient tolerated Eliquis. -Patient will be discharged home on Eliquis with outpatient follow-up with PCP.  Acute deep vein thrombosis (DVT) of left lower extremity (HCC) - Patient presented with shortness of breath on minimal exertion, work-up with acute PE. -Lower extremity Dopplers done positive for left lower extremity femoral vein, popliteal vein, peroneal veins, gastrocnemius vein DVT.  -Patient initially placed on IV heparin and subsequently transition to Eliquis which she tolerated. -Patient be discharged home on Eliquis. -Outpatient follow-up with PCP.  Hypokalemia - Repleted,  Glaucoma - Patient maintained on home regimen of latanoprost, dorzolamide, Combigan  BPH (benign prostatic hyperplasia) - Patient maintained on home regimen of proscar, rapaflo (Flomax per formulary)        Procedures: CT angiogram chest 05/19/2021 Chest  x-ray 05/19/2021 2D echo 05/19/2021 Lower extremity Dopplers 05/19/2021 CT abdomen and pelvis 05/21/2021   Consultations: Interventional radiology: Dr.Mugweru 05/19/2021 PCCM: Dr. Tamala Julian 05/21/2021    Discharge Exam: Vitals:   05/22/21 2129 05/23/21 0749  BP: (!) 109/59 131/70  Pulse: 62 70  Resp: (!) 21 17  Temp: 97.8 F (36.6 C) 98.6 F (37 C)  SpO2: 97% 98%    General: NAD Cardiovascular: RRR no murmurs rubs or gallops.  No JVD.  Left lower extremity swelling/edema Respiratory: Clear to auscultation bilaterally  Discharge Instructions   Discharge Instructions     Diet general   Complete by: As directed    Increase activity slowly   Complete by: As directed       Allergies as of 05/23/2021   No Known Allergies      Medication List     TAKE these medications    acetaminophen 325 MG tablet Commonly known as: TYLENOL Take 2 tablets (650 mg total) by mouth every 6 (six) hours as needed for mild pain (or Fever >/= 101).   apixaban 5 MG Tabs tablet Commonly known as: ELIQUIS Take 2 tablets twice daily until 05/28/20 then reduce to 1 table twice daily   CALCIUM + D PO Take 1 capsule by mouth 2 (two) times daily.  Combigan 0.2-0.5 % ophthalmic solution Generic drug: brimonidine-timolol Place 1 drop into both eyes 2 (two) times daily.   dorzolamide 2 % ophthalmic solution Commonly known as: TRUSOPT Place 1 drop into both eyes 2 (two) times daily.   finasteride 5 MG tablet Commonly known as: PROSCAR TAKE 1 TABLET BY MOUTH EVERY DAY   latanoprost 0.005 % ophthalmic solution Commonly known as: XALATAN Place 1 drop into both eyes at bedtime.   methotrexate 2.5 MG tablet Commonly known as: RHEUMATREX Take 15 mg by mouth once a week. Caution:Chemotherapy. Protect from light.take   omeprazole 20 MG capsule Commonly known as: PRILOSEC Take 20 mg by mouth every morning.   oxyCODONE-acetaminophen 5-325 MG tablet Commonly known as: Roxicet Take 1 tablet by mouth  every 4 (four) hours as needed for severe pain.   silodosin 8 MG Caps capsule Commonly known as: RAPAFLO TAKE 1 CAPSULE BY MOUTH EVERY DAY What changed:  how much to take when to take this   sodium bicarbonate 650 MG tablet Take 1 tablet (650 mg total) by mouth 2 (two) times daily for 4 days.   Teriparatide (Recombinant) 600 MCG/2.4ML Soln Inject 20 mcg into the skin every 6 (six) months.   Vitamin D3 125 MCG (5000 UT) Caps Take 5 capsules by mouth once a week.       No Known Allergies  Follow-up Information     Bradley Cowman, MD. Schedule an appointment as soon as possible for a visit in 1 week(s).   Specialty: Family Medicine Why: f/u in 1-2 weeks. Contact information: [address] [place] [address] [phone]                  The results of significant diagnostics from this hospitalization (including imaging, microbiology, ancillary and laboratory) are listed below for reference.    Significant Diagnostic Studies: CT Angio Chest PE W/Cm &/Or Wo Cm  Result Date: 05/19/2021 CLINICAL DATA:  Pulmonary embolus EXAM: CT ANGIOGRAPHY CHEST WITH CONTRAST TECHNIQUE: Multidetector CT imaging of the chest was performed using the standard protocol during bolus administration of intravenous contrast. Multiplanar CT image reconstructions and MIPs were obtained to evaluate the vascular anatomy. RADIATION DOSE REDUCTION: This exam was performed according to the departmental dose-optimization program which includes automated exposure control, adjustment of the mA and/or kV according to patient size and/or use of iterative reconstruction technique. CONTRAST:  49m OMNIPAQUE IOHEXOL 350 MG/ML SOLN COMPARISON:  None. FINDINGS: Cardiovascular: Pulmonary embolus of the distal right pulmonary artery, right lobar arteries and segmental/subsegmental pulmonary arteries of the right lung and left lower lobe. Evidence of right heart strain with increased RV to LV ratio. Normal heart size.  No pericardial effusion. Mild coronary artery calcifications of the LAD. Atherosclerotic disease of the thoracic aorta. Mediastinum/Nodes: Esophagus and thyroid are unremarkable. No pathologically enlarged lymph nodes seen in the chest. Lungs/Pleura: Central airways are patent. Mild patchy ground-glass opacities of the right lower lobe and right upper lobe. Bibasilar atelectasis. No consolidation, pleural effusion or pneumothorax. Upper Abdomen: Simple cyst of the left kidney and prominent left extrarenal pelvis no acute abnormality. Musculoskeletal: No chest wall abnormality. No acute or significant osseous findings. Review of the MIP images confirms the above findings. IMPRESSION: 1. Pulmonary embolus of the distal right pulmonary artery, right lobar arteries and segmental/subsegmental pulmonary arteries of the right lung and left lower lobe 2. CT evidence of right heart strain. 3. Mild patchy ground-glass opacities of the right lower lobe and right upper lobe, likely infectious or inflammatory.  4. Coronary artery calcifications and aortic Atherosclerosis (ICD10-I70.0). Critical Value/emergent results were called by telephone at the time of interpretation on 05/19/2021 at 2:32 pm to provider [facility] , who verbally acknowledged these results. Electronically Signed   By: Yetta Glassman M.D.   On: 05/19/2021 14:35   CT ABDOMEN PELVIS W CONTRAST  Result Date: 05/21/2021 CLINICAL DATA:  Unintended weight loss, left lower extremity edema EXAM: CT ABDOMEN AND PELVIS WITH CONTRAST TECHNIQUE: Multidetector CT imaging of the abdomen and pelvis was performed using the standard protocol following bolus administration of intravenous contrast. RADIATION DOSE REDUCTION: This exam was performed according to the departmental dose-optimization program which includes automated exposure control, adjustment of the mA and/or kV according to patient size and/or use of iterative reconstruction technique. CONTRAST:  145m  OMNIPAQUE IOHEXOL 300 MG/ML  SOLN COMPARISON:  05/19/2021 FINDINGS: Lower chest: The segmental right lower lobe pulmonary embolus seen on prior study is again identified. Peripheral ground-glass consolidation within the right lower lobe again noted and unchanged, which could reflect localized airspace disease, scarring, or developing infarct. No effusion. Hepatobiliary: No focal liver abnormality is seen. No gallstones, gallbladder wall thickening, or biliary dilatation. Pancreas: Unremarkable. No pancreatic ductal dilatation or surrounding inflammatory changes. Spleen: Normal in size without focal abnormality. Adrenals/Urinary Tract: Moderate left renal cortical atrophy and scarring. Right kidney enhances normally. No urinary tract calculi or obstructive uropathy within either kidney. Bilateral extrarenal pelves, left more prominent than right. Simple cyst upper pole left kidney. The adrenals are grossly unremarkable. Small diverticulum off the left anterolateral aspect of the bladder. No filling defect. Stomach/Bowel: No bowel obstruction or ileus. Diffuse colonic gas fluid levels may reflect diarrhea or recent laxative/enema use. Normal appendix right lower quadrant. There is possible mural thickening within the gastric cardia, though evaluation is limited due to lack of oral contrast administration. If abnormality of the gastric cardia is suspected or clinically important to document, upper endoscopy could be considered. Vascular/Lymphatic: Mild atherosclerosis throughout the abdominal aorta. There is asymmetric dilation of the left common femoral and external iliac vein, without evidence of filling defect. Given the recent presentation of acute pulmonary embolus, left lower extremity DVT cannot be excluded. Correlation with Doppler ultrasound recommended. No pathologic adenopathy within the abdomen or pelvis. Reproductive: Prostate is unremarkable. Other: No free fluid or free gas.  No abdominal wall hernia.  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 Prior healed bilateral inferior rami fractures. There are no acute or destructive bony lesions. Reconstructed images demonstrate no additional findings. IMPRESSION: 1. Colonic gas fluid levels which may reflect diarrhea or recent laxative/enema use. No bowel obstruction or ileus. 2. Asymmetric dilation of the left external iliac and common femoral vein, without obvious filling defect. Given recent diagnosis of pulmonary embolus, left lower extremity DVT cannot be excluded. Left lower extremity Doppler duplex ultrasound may be useful. 3. Nonspecific gastric cardia wall thickening given decompressed state and lack of oral contrast administration. If gastric pathology is suspected, upper endoscopy may be useful. 4. Segmental right lower lobe pulmonary embolus again identified. Please see previous CT discussion 05/19/2021. 5. Stable peripheral consolidation within the right lower lobe, which may reflect acute airspace disease, scarring, or pulmonary infarct. 6.  Aortic Atherosclerosis (ICD10-I70.0). Electronically Signed   By: MRanda NgoM.D.   On: 05/21/2021 18:10   DG Chest Port 1 View  Result Date: 05/19/2021 CLINICAL DATA:  Shortness of breath. EXAM: PORTABLE CHEST 1 VIEW COMPARISON:  None. FINDINGS: The heart size  and mediastinal contours are within normal limits. Both lungs are clear. The visualized skeletal structures are unremarkable. IMPRESSION: No active disease. Electronically Signed   By: Marijo Conception M.D.   On: 05/19/2021 12:11   VAS [place] LOWER EXTREMITY VENOUS (DVT) (7a-7p)  Result Date: 05/19/2021  Lower Venous DVT Study Patient Name:  Bradley Lewis  Date of Exam:   05/19/2021 Medical Rec #: [id number]      Accession #:    [id number] Date of Birth: 01/05/1936      Patient Gender: M Patient Age:   46 years Exam Location:  [facility] Procedure:      VAS [place] LOWER EXTREMITY VENOUS (DVT) Referring Phys:  Daleen Bo --------------------------------------------------------------------------------  Indications: Pulmonary embolism, and Pain.  Comparison Study: no prior Performing Technologist: Archie Patten RVS  Examination Guidelines: A complete evaluation includes B-mode imaging, spectral Doppler, color Doppler, and power Doppler as needed of all accessible portions of each vessel. Bilateral testing is considered an integral part of a complete examination. Limited examinations for reoccurring indications may be performed as noted. The reflux portion of the exam is performed with the patient in reverse Trendelenburg.  +---------+---------------+---------+-----------+----------+--------------+  RIGHT     Compressibility Phasicity Spontaneity Properties Thrombus Aging  +---------+---------------+---------+-----------+----------+--------------+  CFV       Full            Yes       Yes                                    +---------+---------------+---------+-----------+----------+--------------+  SFJ       Full                                                             +---------+---------------+---------+-----------+----------+--------------+  FV Prox   Full                                                             +---------+---------------+---------+-----------+----------+--------------+  FV Mid    Full                                                             +---------+---------------+---------+-----------+----------+--------------+  FV Distal Full                                                             +---------+---------------+---------+-----------+----------+--------------+  PFV       Full                                                             +---------+---------------+---------+-----------+----------+--------------+  POP       Full            Yes       Yes                                    +---------+---------------+---------+-----------+----------+--------------+  PTV       Full                                                              +---------+---------------+---------+-----------+----------+--------------+  PERO      Full                                                             +---------+---------------+---------+-----------+----------+--------------+   +---------+---------------+---------+-----------+----------+--------------+  LEFT      Compressibility Phasicity Spontaneity Properties Thrombus Aging  +---------+---------------+---------+-----------+----------+--------------+  CFV       Full            Yes       Yes                                    +---------+---------------+---------+-----------+----------+--------------+  SFJ       Full                                                             +---------+---------------+---------+-----------+----------+--------------+  FV Prox   None                                             Acute           +---------+---------------+---------+-----------+----------+--------------+  FV Mid    None                                             Acute           +---------+---------------+---------+-----------+----------+--------------+  FV Distal None                                             Acute           +---------+---------------+---------+-----------+----------+--------------+  PFV       Full                                                             +---------+---------------+---------+-----------+----------+--------------+  POP       None            No        No                     Acute           +---------+---------------+---------+-----------+----------+--------------+  PTV       Full                                                             +---------+---------------+---------+-----------+----------+--------------+  PERO      None                                             Acute           +---------+---------------+---------+-----------+----------+--------------+  Gastroc   None                                             Acute            +---------+---------------+---------+-----------+----------+--------------+     Summary: RIGHT: - There is no evidence of deep vein thrombosis in the lower extremity.  - No cystic structure found in the popliteal fossa.  LEFT: - Findings consistent with acute deep vein thrombosis involving the left femoral vein, left popliteal vein, left peroneal veins, and left gastrocnemius veins. - No cystic structure found in the popliteal fossa.  *See table(s) above for measurements and observations. Electronically signed by Harold Barban MD on 05/19/2021 at 11:21:08 PM.    Final    ECHOCARDIOGRAM LIMITED  Result Date: 05/19/2021    ECHOCARDIOGRAM LIMITED REPORT   Patient Name:   Bradley Lewis Date of Exam: 05/19/2021 Medical Rec #:  [id number]     Height:       76.0 in Accession #:    [id number]    Weight:       180.0 lb Date of Birth:  [birth year]     BSA:          2.118 m Patient Age:    78 years      BP:           146/70 mmHg Patient Gender: M             HR:           89 bpm. Exam Location:  Inpatient Procedure: Limited Echo Indications:    Pulmonary embolism  History:        Patient has no prior history of Echocardiogram examinations.  Sonographer:    Jefferey Pica Referring Phys: [place]  Sonographer Comments: Technically difficult study due to poor echo windows. Image acquisition challenging due to respiratory motion. IMPRESSIONS  1. Left ventricular ejection fraction, by estimation, is 50 to 55%. The left ventricle has low normal function.  2. + McConnell's sign - c/w pulmonary embolus with RV strain . Marland Kitchen Right ventricular systolic function is moderately reduced. The right ventricular size is moderately enlarged.  3. The aortic valve is tricuspid. Aortic valve regurgitation is not  visualized. No aortic stenosis is present. FINDINGS  Left Ventricle: Left ventricular ejection fraction, by estimation, is 50 to 55%. The left ventricle has low normal function. Right Ventricle: + McConnell's sign - c/w pulmonary  embolus with RV strain. The right ventricular size is moderately enlarged. Right ventricular systolic function is moderately reduced. Aortic Valve: The aortic valve is tricuspid. Aortic valve regurgitation is not visualized. No aortic stenosis is present. LEFT VENTRICLE PLAX 2D LVIDd:         5.50 cm LVIDs:         3.50 cm LV PW:         1.10 cm LV IVS:        1.10 cm LVOT diam:     2.00 cm LVOT Area:     3.14 cm  IVC IVC diam: 2.60 cm LEFT ATRIUM           Index LA diam:      4.40 cm 2.08 cm/m LA Vol (A2C): 40.8 ml 19.26 ml/m   AORTA Ao Root diam: 4.80 cm  SHUNTS Systemic Diam: 2.00 cm Mertie Moores MD Electronically signed by Mertie Moores MD Signature Date/Time: 05/19/2021/5:17:16 PM    Final     Microbiology: Recent Results (from the past 240 hour(s))  Resp Panel by RT-PCR (Flu A&B, Covid) Nasopharyngeal Swab     Status: None   Collection Time: 05/19/21  5:22 PM   Specimen: Nasopharyngeal Swab; Nasopharyngeal(NP) swabs in vial transport medium  Result Value Ref Range Status   SARS Coronavirus 2 by RT PCR NEGATIVE NEGATIVE Final    Comment: (NOTE) SARS-CoV-2 target nucleic acids are NOT DETECTED.  The SARS-CoV-2 RNA is generally detectable in upper respiratory specimens during the acute phase of infection. The lowest concentration of SARS-CoV-2 viral copies this assay can detect is 138 copies/mL. A negative result does not preclude SARS-Cov-2 infection and should not be used as the sole basis for treatment or other patient management decisions. A negative result may occur with  improper specimen collection/handling, submission of specimen other than nasopharyngeal swab, presence of viral mutation(s) within the areas targeted by this assay, and inadequate number of viral copies(<138 copies/mL). A negative result must be combined with clinical observations, patient history, and epidemiological information. The expected result is Negative.  Fact Sheet for Patients:   EntrepreneurPulse.com.au  Fact Sheet for Healthcare Providers:  IncredibleEmployment.be  This test is no t yet approved or cleared by the [place] FDA and  has been authorized for detection and/or diagnosis of SARS-CoV-2 by FDA under an Emergency Use Authorization (EUA). This EUA will remain  in effect (meaning this test can be used) for the duration of the COVID-19 declaration under Section 564(b)(1) of the Act, 21 U.S.C.section 360bbb-3(b)(1), unless the authorization is terminated  or revoked sooner.       Influenza A by PCR NEGATIVE NEGATIVE Final   Influenza B by PCR NEGATIVE NEGATIVE Final    Comment: (NOTE) The Xpert Xpress SARS-CoV-2/FLU/RSV plus assay is intended as an aid in the diagnosis of influenza from Nasopharyngeal swab specimens and should not be used as a sole basis for treatment. Nasal washings and aspirates are unacceptable for Xpert Xpress SARS-CoV-2/FLU/RSV testing.  Fact Sheet for Patients: EntrepreneurPulse.com.au  Fact Sheet for Healthcare Providers: IncredibleEmployment.be  This test is not yet approved or cleared by the [place] FDA and has been authorized for detection and/or diagnosis of SARS-CoV-2 by FDA under an Emergency Use Authorization (EUA). This EUA will remain in effect (meaning this  test can be used) for the duration of the COVID-19 declaration under Section 564(b)(1) of the Act, 21 U.S.C. section 360bbb-3(b)(1), unless the authorization is terminated or revoked.  Performed at [facility], [place] [address] [place], [place] [address]      Labs: Basic Metabolic Panel: Recent Labs  Lab 05/19/21 1159 05/20/21 0543 05/20/21 1018 05/21/21 0049 05/22/21 0220 05/23/21 0207  NA 136 135  --  134* 133* 134*  K 3.7 3.0*  --  3.5 3.5 3.8  CL 105 105  --  108 109 109  CO2  --  22  --  18* 17* 17*  GLUCOSE 109* 106*  --  106* 102* 101*  BUN 20 17   --  '18 13 12  '$ CREATININE 1.20 1.20  --  1.19 1.01 0.92  CALCIUM  --  8.1*  --  7.5* 7.1* 7.3*  MG  --   --  1.9  --   --   --    Liver Function Tests: Recent Labs  Lab 05/19/21 1146  AST 32  ALT 20  ALKPHOS 68  BILITOT 1.2  PROT 6.8  ALBUMIN 3.3*   No results for input(s): LIPASE, AMYLASE in the last 168 hours. No results for input(s): AMMONIA in the last 168 hours. CBC: Recent Labs  Lab 05/19/21 1146 05/19/21 1156 05/20/21 0543 05/21/21 0049 05/21/21 2346 05/22/21 0220 05/23/21 0207  WBC 11.1*   < > 7.8 6.7 5.0 5.0 4.2  NEUTROABS 8.4*  --   --   --   --   --   --   HGB 12.4*   < > 10.9* 10.4* 9.5* 9.8* 9.6*  HCT 39.0   < > 34.3* 31.9* 30.6* 30.4* 30.6*  MCV 95.1   < > 92.0 92.5 92.4 91.6 92.4  PLT 214   < > 244 257 252 260 295   < > = values in this interval not displayed.   Cardiac Enzymes: No results for input(s): CKTOTAL, CKMB, CKMBINDEX, TROPONINI in the last 168 hours. BNP: BNP (last 3 results) Recent Labs    05/19/21 2033 05/21/21 0927  BNP 91.3 179.1*    ProBNP (last 3 results) No results for input(s): PROBNP in the last 8760 hours.  CBG: No results for input(s): GLUCAP in the last 168 hours.     Signed:  Irine Seal MD.  [facility] 05/23/2021, 11:32 AM

## 2021-08-27 ENCOUNTER — Other Ambulatory Visit (HOSPITAL_COMMUNITY): Payer: Self-pay
